# Patient Record
Sex: Female | Born: 1980
Health system: Southern US, Community
[De-identification: ages and names within clinical notes are randomized; demographics above are authoritative.]

---

## 2001-06-10 ENCOUNTER — Other Ambulatory Visit: Admission: RE | Admit: 2001-06-10 | Discharge: 2001-06-10 | Payer: Self-pay | Admitting: Obstetrics and Gynecology

## 2001-09-23 ENCOUNTER — Inpatient Hospital Stay (HOSPITAL_COMMUNITY): Admission: AD | Admit: 2001-09-23 | Discharge: 2001-09-23 | Payer: Self-pay | Admitting: Obstetrics and Gynecology

## 2001-11-13 ENCOUNTER — Ambulatory Visit (HOSPITAL_COMMUNITY): Admission: RE | Admit: 2001-11-13 | Discharge: 2001-11-13 | Payer: Self-pay | Admitting: Obstetrics and Gynecology

## 2001-11-13 ENCOUNTER — Inpatient Hospital Stay (HOSPITAL_COMMUNITY): Admission: AD | Admit: 2001-11-13 | Discharge: 2001-11-16 | Payer: Self-pay | Admitting: Obstetrics and Gynecology

## 2002-06-23 ENCOUNTER — Other Ambulatory Visit: Admission: RE | Admit: 2002-06-23 | Discharge: 2002-06-23 | Payer: Self-pay | Admitting: Obstetrics and Gynecology

## 2004-06-28 ENCOUNTER — Other Ambulatory Visit: Admission: RE | Admit: 2004-06-28 | Discharge: 2004-06-28 | Payer: Self-pay | Admitting: Obstetrics and Gynecology

## 2004-08-17 ENCOUNTER — Inpatient Hospital Stay (HOSPITAL_COMMUNITY): Admission: AD | Admit: 2004-08-17 | Discharge: 2004-08-17 | Payer: Self-pay | Admitting: Obstetrics and Gynecology

## 2004-10-20 ENCOUNTER — Ambulatory Visit (HOSPITAL_COMMUNITY): Admission: RE | Admit: 2004-10-20 | Discharge: 2004-10-20 | Payer: Self-pay | Admitting: Obstetrics and Gynecology

## 2004-12-12 ENCOUNTER — Other Ambulatory Visit: Admission: RE | Admit: 2004-12-12 | Discharge: 2004-12-12 | Payer: Self-pay | Admitting: Obstetrics and Gynecology

## 2005-01-24 ENCOUNTER — Inpatient Hospital Stay (HOSPITAL_COMMUNITY): Admission: AD | Admit: 2005-01-24 | Discharge: 2005-01-24 | Payer: Self-pay | Admitting: Obstetrics and Gynecology

## 2005-01-25 ENCOUNTER — Inpatient Hospital Stay (HOSPITAL_COMMUNITY): Admission: AD | Admit: 2005-01-25 | Discharge: 2005-01-25 | Payer: Self-pay | Admitting: Obstetrics and Gynecology

## 2005-03-28 ENCOUNTER — Inpatient Hospital Stay (HOSPITAL_COMMUNITY): Admission: AD | Admit: 2005-03-28 | Discharge: 2005-03-28 | Payer: Self-pay | Admitting: Obstetrics and Gynecology

## 2005-04-01 ENCOUNTER — Inpatient Hospital Stay (HOSPITAL_COMMUNITY): Admission: AD | Admit: 2005-04-01 | Discharge: 2005-04-04 | Payer: Self-pay | Admitting: Obstetrics and Gynecology

## 2007-05-20 ENCOUNTER — Inpatient Hospital Stay (HOSPITAL_COMMUNITY): Admission: AD | Admit: 2007-05-20 | Discharge: 2007-05-20 | Payer: Self-pay | Admitting: Obstetrics and Gynecology

## 2007-07-24 ENCOUNTER — Inpatient Hospital Stay (HOSPITAL_COMMUNITY): Admission: AD | Admit: 2007-07-24 | Discharge: 2007-07-26 | Payer: Self-pay | Admitting: Obstetrics and Gynecology

## 2007-07-25 ENCOUNTER — Encounter (INDEPENDENT_AMBULATORY_CARE_PROVIDER_SITE_OTHER): Payer: Self-pay | Admitting: Obstetrics and Gynecology

## 2010-10-18 NOTE — Op Note (Signed)
NAMEANIDA, [name]                ACCOUNT NO.:  [id number]   MEDICAL RECORD NO.:  [id number]          PATIENT TYPE:  INP   LOCATION:  9128                          FACILITY:  [facility]   PHYSICIAN:  Hal Morales, M.D.DATE OF BIRTH:  Jun 24, 1980   DATE OF PROCEDURE:  07/25/2007  DATE OF DISCHARGE:  07/26/2007                               OPERATIVE REPORT   PREOPERATIVE DIAGNOSIS:  Desire for surgical sterilization and  epigastric hernia.   POSTOPERATIVE DIAGNOSES:  Desire for surgical sterilization and  epigastric hernia.   OPERATION:  Epigastric hernia repair, bilateral tubal sterilization.   SURGEON:  Dr. Leonie Man for the epigastric hernia repair.  Dr. Dierdre Forth for the bilateral tubal sterilization.   ANESTHESIA:  General orotracheal.   ESTIMATED BLOOD LOSS:  Less than 25 mL.   COMPLICATIONS:  None.   FINDINGS:  The tubes were normal for the postpartum state.   PROCEDURE IN DETAIL:  The patient was in the process of undergoing an  epigastric hernia repair.  Once the hernia sac had been excised and,  therefore, left the peritoneum open, the left fallopian tube was  identified, followed to its fimbriated end, then grasped at the isthmic  portion and elevated.  A suture of 2-0 chromic was placed through the  mesosalpinx and tied fore and aft on the knuckle of tube.  A second  ligature was placed proximal to that and the intervening knuckle of tube  excised.  The cut ends were cauterized.  A similar procedure was carried  out on the opposite side and hemostasis noted to be adequate.  At that  time, Dr. Lurene Shadow completed the hernia repair which will be dictated  under a separate operative report.      Hal Morales, M.D.  Electronically Signed     VPH/MEDQ  D:  07/25/2007  T:  07/26/2007  Job:  [id number]

## 2010-10-18 NOTE — Discharge Summary (Signed)
NAMEJESSIKA, [name]                ACCOUNT NO.:  [id number]   MEDICAL RECORD NO.:  [id number]          PATIENT TYPE:  INP   LOCATION:  9128                          [facility]   PHYSICIAN:  Janine Limbo, M.D.DATE OF BIRTH:  1981-03-08   DATE OF ADMISSION:  07/24/2007  DATE OF DISCHARGE:  07/26/2007                               DISCHARGE SUMMARY   ADMITTING DIAGNOSES:  1. Intrauterine pregnancy at 40 and 5/7 weeks.  2. Active labor.  3. Group beta strep negative.  4. Desires bilateral tubal ligation.  5. Supraumbilical hernia.   DISCHARGE DIAGNOSES:  1. Intrauterine pregnancy at term.  2. Status post spontaneous vaginal delivery of a viable female on      February 18 weighing 7 pounds 10 ounces with Apgars 8 and 8.  3. Status post bilateral tubal ligation.  4. Breastfeeding.  5. Status post epigastric hernia repair.   PROCEDURE:  1. General anesthesia.  2. Epigastric hernia repair by Dr. Lurene Shadow.  3. Bilateral tubal ligation by Dr. Pennie Rushing.   HOSPITAL COURSE:  [name] is a 30 year old married black female gravida  3, para 2-0-0-2 who presented on day of admission at 38 and 5/7 weeks in  active labor.  She had been followed by MD service at [facility] and was  desiring postpartum bilateral tubal ligation.  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  On admission the patient's cervix was 4-5 cm.  She was  admitted to labor and delivery where she received a dose of Stadol.  Fetal heart tracing was reassuring and reactive.  She was contracting  every 2-7 minutes on the monitor.  Just before or just at 7:00 Dr.  Estanislado Pandy performed artificial rupture of membranes, clear fluid.  The  patient's cervix was 5, 80, -2 and the patient was comfortable with her  Stadol.  The patient progressed there soon after to a spontaneous  vaginal delivery at 2049 hours on  February 18 of a viable female by the  name of Melinda [name].  Apgars were 8 and 8.  Weight was 7 pounds 10 ounces.  She had an intact perineum.  She had a less than 1 cm vulvar tear that  was repaired with a 4-0 Monocryl and the patient was doing well.  She  continued to desire postpartum bilateral tubal ligation.  She was taken  to mother/baby after her recovery in L and D.  By postpartum day #1 she  was n.p.o. and awaiting her bilateral tubal ligation that was scheduled  for 3:30 in the afternoon.  She said she mainly had pain from abdominal  fundal cramping which was well controlled with Motrin.  She had normal  lochia.  She was voiding without difficulty with slight stinging.  She  had had some difficulty arousing newborn female for feeds.  She was breast  feeding and newborn female did receive circumcision on postpartum day #1.  Her vital signs were stable.  Her hemoglobin was 12.7 and  white count  was 13.6.  Platelets were stable at 189.  Her physical examination was  within normal limits.  She did have 1+ edema in her bilateral lower  extremities but negative Homan.  Fundus was firm, U minus 1.   She was taken to the OR and procedures were complete by 5 p.m.  Dr.  Lurene Shadow first performed her epigastric hernia repair and then Dr. Pennie Rushing  came in for bilateral tubal ligation.  She was under general anesthesia.  There were no complications.  Minimal blood loss and the patient was  taken to the PACU in good condition.  By postpartum day #2 the patient  was up at her bedside.  She was doing well.  She reports lochia was  decreased like a period.  Her abdominal pain was only with position  changes and with movement.  She reported that breast feeding was  improved and newborn female was doing much better with that.  She denied  nausea, vomiting, diarrhea, PIH or UTI signs or symptoms.  No bowel  movement yet but positive flatus.  She was voiding without difficulty  and tolerating a regular diet, was  up ad lib and was ready to go home.  Her vital signs remained stable.  She did have a slightly elevated heart  rate status post her bilateral tubal ligation and hernia repair.  Heart  rate was running 99-113 whereas prior to her surgery she was 84-101.  Her physical examination was within normal limits.  Lungs were clear.  Breasts were soft, nontender and intact.  Her abdomen was soft.  It was  appropriately tender.  Her postop dressing was clean, dry and intact.  It was midline and vertical.  Bowel sounds were present x4 quadrants.  Her fundus was firm and below her umbilicus.  Her extremities, she  continued to have some generalized edema but no Homan.  The patient was  deemed to have received full benefit of her hospital stay and was  discharged home.   DISCHARGE INSTRUCTIONS:  Were per [facility] pamphlet.  Warning signs and  symptoms to report were reviewed.   DISCHARGE MEDICATIONS:  1. Motrin 600 mg one tab p.o. q.6 h. p.r.n. pain.  2. Vicodin one to two tabs p.o. q.4-6 h. p.r.n. moderate to severe      pain.   The patient was discharged to home.  Condition was stable.  Followup to  occur in 6 weeks or p.r.n.      Candice Denny Levy, [place]      Janine Limbo, M.D.  Electronically Signed    CHS/MEDQ  D:  07/26/2007  T:  07/26/2007  Job:  [id number]

## 2010-10-18 NOTE — H&P (Signed)
Melinda [name], Melinda [name]                ACCOUNT NO.:  [id number]   MEDICAL RECORD NO.:  [id number]          PATIENT TYPE:  INP   LOCATION:  9173                          [facility]  [facility]   PHYSICIAN:  Dois Davenport A. Rivard, M.D. DATE OF BIRTH:  10-15-1980   DATE OF ADMISSION:  07/24/2007  DATE OF DISCHARGE:                              HISTORY & PHYSICAL   PRIORITY ADMISSION HISTORY AND PHYSICAL   Patient is a 30 year old, married, black female, gravida 3, para 2-0-0-  2, 40 and 5/7th weeks, who was sent from the office for a probable  labor.  Patient reports contractions beginning this morning and went  into the office for monitoring, was having regular contractions.  RN  reported some mild variables with her contractions.  She was breathing  with her contractions at the office.  They did not check her cervix  there.  She reports 2 cm earlier in the office this week.  Denies  leakage of fluid, vaginal bleeding, abnormal discharge, prodromal  herpetic symptoms, UTI or PIH signs or symptoms, nausea, vomiting,  diarrhea, shortness of breath, cough, or fever.  Reports positive fetal  movement, states they are expecting a boy.  She is breathing with her  contractions.  She reports increased vaginal pressure with her  contractions.  Followed by MD Service at [facility].  She desires a postpartum  BTL.   PAST MEDICAL HISTORY:  1. A supraumbilical hernia, which she states that the doctors were      going to try to repair when she had her postpartum BTL.  2. History of HSV-2.  She is on Valtrex prophylaxis.  3. History of PIH.  4. History of abnormal Pap.  5. GBS negative.   PRENATAL LABORATORY DATA:  Patient is A-positive, Rh antibody screen  negative, sickle cell negative, RPR nonreactive, Rubella immune,  hepatitis surface antigen negative, HIV nonreactive, cystic fibrosis  negative.  Her hemoglobin at her new OB visit was 12.5, and her  platelets were 310.  Her hemoglobin on November 17th was 12.1.   She had  her Glucola that day as well, which was within normal limits.  Group  Beta Strep negative.  Patient declined gonorrhea and Chlamydia of third  trimester.   PAST SURGICAL HISTORY:  1. Gravida 1:  The patient had a spontaneous vaginal delivery in June      of 2003, a female infant weighing 7 pounds 5 ounces, at just over 39      weeks.  She was induced for [facility] and was delivered by a nurse      midwife.  2. Gravida 2:  Spontaneous vaginal delivery in October of 2006, a female      infant weighing 6 pounds 11 ounces, at 41 and 2/7th weeks.  She was      induced for post dates.  3. Gravida 3:  Current pregnancy.   PAST GYNECOLOGIC HISTORY:  Patient reports menarche at 30 years of age,  a 28-day cycle.  She had PIH with her first pregnancy.  She also had  Group Beta Strep with her first pregnancy, history  of HSV-2 diagnosed in  2002 with no recent outbreaks, reports oral contraceptive pills for  contraceptive use in the past, reports occasional yeast infection,  abnormal Pap in 2006 with ASCUS, positive high-risk HPV.  She reports  normal childhood illnesses and has only been hospitalized in the past  for childbirth x2.   GENETIC HISTORY:  Unremarkable.   FAMILY HISTORY:  Maternal great-grandmother with an MI, her mother and  maternal grandmother hypertension, mother with varicosities, maternal  grandmother diabetic, mom with thyroid disease, and mom on dialysis.   SOCIAL HISTORY:  Married, black female.  Reports Pentecostal faith.  Husband's name is Aurther Loft.  The patient reports 15 years of education,  works at [employer] full time.  Father of the baby 14 years of education and  is a full time Paediatric nurse.  She denied any alcohol, tobacco, or illicit drug  use.   HISTORY OF PRESENT PREGNANCY:  The patient was seen on July 22nd for a  new OB, was approximately 10 and 4/7th weeks, at that time was planning  CNM care.  She had an ultrasound that day secondary to inability to hear  fetal heart  tones showing SIUP at 10 and 1/7th weeks, EDC was kept at  February 13.  Pap and cultures were done.  She declined first trimester  screen at that time.  Her Pap was within normal limits in March of 2008.  She had a visit at 45 and 3/7th weeks where she had anatomy ultrasound.  A hernia was diagnosed by Dr. Pennie Rushing.  She noticed a 5-cm bulge to  right of midline with Valsalva, which was easily reduced and minimal  tenderness.  She did have a Development worker, international aid consult, her ultrasound with  SIUP, size equal to dates, normal fluid, suggest of female, all anatomy  was seen, and normal growth and development noted.  The patient seen  then at 23 and 2/7th weeks.  She had had a consult with Dr. Lurene Shadow, had  noted may try to repair after her delivery with BTL.  The patient was  seen for a work-in at 27 weeks for some spotting.  No blood was noted in  the vault at the time of her exam.  The cervix was fingertip and long.  She was given bleeding precautions.  The patient returned at 59 and  4/7th weeks, had a one-hour GTT, had not experienced any other spotting  at that time.  She was given a Valtrex prescription for refills.  The  patient's pregnancy continued to progress without notable complications.  She complained of a lump on her left labia and probable HSV outbreak at  16 and 5/7th weeks, but on exam per Dr. Normand Sloop was consistent with a  sebaceous cyst.  Did note an HSV outbreak on January 3rd and Valtrex 500  mg at that time was started x3 days and then to continue until delivery.  The patient's cervix was closed at 35 and 4/7th weeks.  A GBS was done,  which was negative.  She declined gonorrhea and Chlamydia cultures.  She  was given Vicodin at approximately 37 weeks secondary to cramping and  back pain.  Her cervix was fingertip.  Internal os was closed.   PHYSICAL EXAMINATION:  VITAL SIGNS ON ADMISSION:  130/85 blood pressure,  98.1 temperature, respirations 20, heart rate 98, EFM 130  baseline,  reactive, moderate variability and early decels.  Toco:  Uterine  contractions every 2 to 7 minutes, moderate on palpation.  GENERAL:  Alert and oriented x3.  Labored breathing with her  contractions.  HEENT:  Within normal limits.  LUNGS:  Clear to auscultation bilaterally.  CARDIOVASCULAR:  Regular rate and rhythm without murmur.  ABDOMEN:  Soft and nontender, gravid, estimated fetal weight 7-1/2 to 8  pounds.  PELVIC EXAM:  The cervix was 4+, 70%, minus 2 to minus 3, vertex,  bulging bag of water to patient's left, a stretchy cervix, no external  or internal abnormalities.  EXTREMITIES:  DTRs 1+, no clonus, 1+ edema.   IMPRESSION:  1. Intrauterine pregnancy at 40 and 5.  2. Active labor.  3. Group Beta Strep negative.  4. Desires bilateral tubal ligation.  5. Supraumbilical hernia.  6. Desires Stadol for labor pain.   PLAN:  1. Admit to [facility] with Dr. Estanislado Pandy as attending physician.  2. Routine L&D orders.  3. Support p.r.n.  4. Anticipate SVD.  5. Dr. Estanislado Pandy plans AROM if labor augmentation necessary.      Candice Garrison, CNM      Dois Davenport A. Rivard, M.D.  Electronically Signed    CHS/MEDQ  D:  07/24/2007  T:  07/24/2007  Job:  [id number]

## 2010-10-18 NOTE — Op Note (Signed)
Melinda [name], Melinda [name]                ACCOUNT NO.:  [id number]   MEDICAL RECORD NO.:  [id number]          PATIENT TYPE:  INP   LOCATION:  9128                          [facility]   PHYSICIAN:  Leonie Man, M.D.   DATE OF BIRTH:  07-Aug-1980   DATE OF PROCEDURE:  07/24/2007  DATE OF DISCHARGE:  07/26/2007                               OPERATIVE REPORT   PREOPERATIVE DIAGNOSIS:  Ventral epigastric hernia.   POSTOPERATIVE DIAGNOSIS:  Ventral epigastric hernia.   PROCEDURE:  Repair of ventral hernia with mesh.   SURGEON:  Leonie Man, M.D.   ASSISTANT:  Hal Morales, M.D.   NOTE:  The patient is a 30 year old female who is now recently  postpartum, noted to have a fairly large ventral epigastric hernia which  became somewhat more symptomatic during pregnancy.  She delivered her  baby spontaneously approximately 24 hours ago and is coming to the  operating room now under the care of Dr. Pennie Rushing for a postpartum tubal  ligation.  She wishes at this point to have her ventral hernia repaired.  She understands the risks and potential benefits of surgery and gives  her consent to same.   PROCEDURE:  The patient is positioned supinely and following the  induction of satisfactory general anesthesia, the abdomen is prepped and  draped to be included in the sterile operative field.  The patient time-  out identifying the patient as Melinda [name] and the operation to be done  as ventral hernia and postpartum tubal ligation was made.  I made a  midline incision just above the umbilicus, deepening this through the  skin and subcutaneous tissues down to the region of the epigastric  hernia, which had incarcerated round ligament within it.  This was  dissected free on all sides with the dissection carried down to the  fascia.  The fascia was somewhat attenuated.  The hernia sac was opened  and the adhesions to the sac were taken down.  I then dissected away the  round ligament and  transected it.  Dr. Pennie Rushing then at that point when  ahead and did a postpartum tubal ligation, which will be dictated in a  separate note.  At the end of the tubal ligation I placed a 8.4-cm of  Bard Ventralex mesh within the defect and suturing this to the sides of  the fascia.  This was done with 0 Prolene sutures.  I then closed the  defect over the mesh with a running 0 Prolene.  Sponge and instrument  counts were doubly verified.  The incision was then closed with  interrupted 2-0  Vicryl sutures, followed by running 4-0 Monocryl sutures reinforced with  Steri-Strips.  Sterile dressings were applied, the anesthetic reversed  and the patient removed from the operating room to the recovery room in  stable condition.  She tolerated the procedure well.      Leonie Man, M.D.  Electronically Signed     PB/MEDQ  D:  07/25/2007  T:  07/26/2007  Job:  [id number]   cc:   Hal Morales, M.D.  Fax:  [phone] 

## 2010-10-21 NOTE — H&P (Signed)
[facility]  Patient:    Melinda [name], Melinda [name] Visit Number: [id number] MRN: [id number]          Service Type: OBS Location: 910B 9162 01 Attending Physician:  Esmeralda Arthur Dictated by:   Nigel Bridgeman, C.N.M. Admit Date:  11/13/2001                           History and Physical  HISTORY OF PRESENT ILLNESS:   The patient is a 30 year old gravida 1, para 0, at 39-3/7th weeks who presents on November 13, 2001 for induction secondary to mild pregnancy-induced hypertension, and in the third trimester.  Pregnancy has been remarkable for:  1. Late care with patient beginning care at approximately 17 weeks. 2. Positive group B strep. 3. History of HSV with no current lesions or recent outbreak. 4. Mild PIH in the third trimester.  PRENATAL LABORATORY DATA:     Blood type is A-positive.  Rh antibody negative. VDRL nonreactive.  Rubella titer positive.  Hepatitis B surface antigen negative.  Sickle cell test negative.  Pap was normal.  GC and Chlamydia cultures were negative.  Glucose challenge was normal.  AFP was normal.  EDC of November 16, 2001 was established by ultrasound at approximately 17 weeks. Hemoglobin upon entry into practice was 13.  It was 12 at 27 weeks.  Group B strep culture was positive at 36 weeks.  HISTORY OF PRESENT PREGNANCY:                    The patient entered care at approximately 17 weeks.  She had an ultrasound at that time which established dating.  She had some occasional hypoglycemic episodes that resolved with food intake.  She did have upper respiratory symptoms for nine weeks or so.  She had a positive beta strep noted at 33 weeks.  She began to have some mild elevations of her blood pressure at 37 weeks, 110/80 was the value at that point.  Her prevalues were 110/70.  She had had approximately eight pound weight gain in almost ten days.  She had 1-2+ edema in the lower extremities.  She was placed on bed rest at that  time.  Her cervix was a fingertip and 60%.  She had no protein in her urine.  Over the next three to four visits, her pressures were 126/100, although it resolved to 120/86 with left side lying position, 120/80 on November 07, 2001, and on November 11, 2001, it was 140/100 with a recheck of 100/86.  On that examination, her cervix was 1-2, 70% vertex, and -2.  There was 1-2+ edema in the lower extremities.  She had no headache, visual symptoms, or epigastric pain.  A consultation was held with Dr. Normand Sloop regarding this patient appropriateness for induction.  She did agree that this was a reasonable plan. A 24 hour urine was begun on November 12, 2001, to be completed on November 13, 2001. This will determine if magnesium sulfate therapy is needed for labor.  OBSTETRICAL HISTORY:          The patient is a primigravida.  PAST MEDICAL HISTORY:         She had never had a gynecological exam prior to pregnancy.  She was diagnosed with HSV in 2002.  She had an outbreak at the very first of her pregnancy, but none since then.  She has occasional yeast infections.  She reports usual childhood illnesses.  ALLERGIES:                    No known drug allergies.  FAMILY HISTORY:               Maternal great-grandmother had a heart attack. Her mother and maternal grandmother have hypertension.  Her mother has varicose veins.  Maternal grandmother has some type of diabetes.  Maternal grandmother had a questionable thyroid problem.  Maternal grandmother was also on dialysis.  Her mother had migraines when she was younger.  There is a history of some alcohol use on the patients fathers side.  GENETIC HISTORY:              Unremarkable.  SOCIAL HISTORY:               The patient is engaged to the father of the baby.  He is involved and supportive.  His name is Junie Avilla.  The patient is [place] of the Pentecostal faith.  She has three years of college. She is continuing college and is also employed at a  [employer].  Her husband has two years of college.  He is employed as a Paediatric nurse.  She has been followed by the certified nurse midwife service at [facility].  She denies any alcohol, drug, or tobacco use during this pregnancy.  PHYSICAL EXAMINATION:  VITAL SIGNS:                  Blood pressure on last evaluation was 140/100 with repeat of 100/86.  Other vital signs are stable.  HEENT:                        Within normal limits.  LUNGS:                        Bilateral breath sounds are clear.  HEART:                        Regular rate and rhythm without murmur.  BREASTS:                      Soft and nontender.  ABDOMEN:                      Fundal height approximately 39 cm.  Estimated fetal weight of 7 pounds to 7-1/2 pounds.  There are occasional uterine contractions noted.  Fetal heart rate is 150 by Dopplers and this was November 11, 2001.  EXTREMITIES:                  Deep tendon reflexes are 2+ without clonus. There is 1-2+ edema noted in the lower extremities.  PELVIC EXAMINATION:           On November 11, 2001, was 1-2 cm, 70% vertex, at a -2 station.  LABORATORY DATA:              Urine on November 11, 2001, was trace for protein on dipstick.  IMPRESSION:                   1. Intrauterine pregnancy at 39-3/7th weeks.                               2. Pregnancy-induced hypertension.  3. Positive group B streptococcus.                               4. History of herpes simplex virus with no                                  current lesions.  PLAN:                         1. Admit to birthing suite per consult with                                  Dr. Dois Davenport Rivard as current on call                                  physician and Dr. Estanislado Pandy is on coming on November 13, 2001.                               2. Routine CNM orders.                               3. A 24-hour urine will have been completed                                   earlier in the day on November 13, 2001, with                                  CBC, hold to clot, RPR, and PIH labs done at                                   that time.  These values will be evaluated to                                  determine whether the patient requires                                  magnesium sulfate for labor.                               4. Plan Cytotec placement on the evening of November 13, 2001, with initiation of Pitocin  induction on the morning of November 14, 2001.                               5. Plan group B strep prophylaxis with                                  penicillin G per standard dosing once labor                                  ensues.                               6. Close observation of maternal fetal status.                               7. Pain medication per patient request. Dictated by:   Nigel Bridgeman, C.N.M. Attending Physician:  Esmeralda Arthur DD:  11/13/01 TD:  11/13/01 Job: [id number]

## 2010-10-21 NOTE — H&P (Signed)
NAMEBLANCH, STANG                ACCOUNT NO.:  [id number]   MEDICAL RECORD NO.:  [id number]          PATIENT TYPE:  INP   LOCATION:  9167                          FACILITY:  [facility]   PHYSICIAN:  Janine Limbo, M.D.DATE OF BIRTH:  Sep 01, 1980   DATE OF ADMISSION:  04/01/2005  DATE OF DISCHARGE:                                HISTORY & PHYSICAL   Ms. Nodal is a 30 year old married black female, gravida 2 para 1-0-0-1 at 67  and 2/7 weeks, who presents for induction of labor secondary to post dates.  She denies bleeding, leaking or signs and symptoms of PIH.  She is having  occasional mild contractions.  Her pregnancy has been followed by the  [facility] GYN certified nurse midwife service and is remarkable for:  1. History of PIH.  2. History of abnormal Pap with high risk HPV.  3.  History of HSV.  4. Group-B strep negative.  She denies signs and symptoms  of  HPV since March 17, 2005 and she has been taking Valtrex during the  end of her third trimester.  Her prenatal labs were collected on August 30, 2004.  Hemoglobin 13.6, hematocrit 39.7, platelets 314,000.  Blood type A  positive.  Antibody negative.  Sickle cell trait negative.  RPR nonreactive.  Rubella immune.  Hepatitis C surface antigen negative.  Gonorrhea negative  Chlamydia negative.  Her one hour Glucola was collected on January 05, 2005  and was 119.  Hemoglobin at that time was 12.8.  Culture of the vaginal  tract for Group-B strep on February 24, 2005 was negative.   HISTORY OF PRESENT PREGNANCY:  The patient presented for care at [facility] on August 30, 2004 at 10 and 5/[redacted] weeks gestation.  Pregnancy  ultrasonography was obtained at that visit due to no audible fetal heart  tones.  Ultrasonography at [redacted] weeks gestation showed growth consistent with  previous dating.  Anatomy scan was complete at 21 and 4/[redacted] weeks gestation  with estimated fetal weight in the 25th-50th percentile at that time with  normal  fluid.  The patient was to have a repeat Pap smear in July 2006 due  to an abnormal Pap smear in January 2006 and a colposcopy in March 2006.  Previous Pap result in January showed ascus with high risk HPV present.  Pap  smear in July 2006 was negative.  The patient was seen at [redacted] weeks gestation  for lower back pain.  The patient was seen at [redacted] weeks gestation for  spotting during wiping, irregular contractions as well as frequent  urination.  Urine culture was negative at that time.  The rest of her  prenatal care was unremarkable.   OB HISTORY:  She is a gravida 2 para 1-0-0-1 in June 2003.  She had a  vaginal delivery of a female infant weighing 7 pounds and 5 ounces at 39 and  4/[redacted] weeks gestation after seven hours of labor.  She was induced due to  elevated blood pressure.  She did not have preeclampsia with that pregnancy.  She had Group-B  strep with her previous pregnancy.   MEDICAL HISTORY:  She has no medication allergies.  She experienced menarche  at the age of 50 with 28 day cycles lasting five days.  She was diagnosed in  2002 with HSV.  She has had a yeast infection in the past. She has had an  abnormal Pap smear of ascus with high risk HPV in January 2006 followed by  colposcopy in March 2006.   FAMILY MEDICAL HISTORY:  Remarkable for mother and maternal grandmother with  hypertension.  Maternal grandmother with unknown type of diabetes.  Maternal  grandmother with unknown disease.  Maternal grandmother with dialysis.  Genetic history is negative.   SOCIAL HISTORY:  The patient is married to the father of the baby.  His name  is Aurther Loft.  He is involved and supportive.  They are of the [employer]  faith.  The patient has had 16 years of education and is employed full time  with [employer].  Father of the baby has had 14 years of education and is  employed full time as a Paediatric nurse.  They deny any alcohol, tobacco or illicit  drug use with the pregnancy.   OBJECTIVE DATA:  Vital  signs are stable.  She is afebrile.  HEENT:  Grossly within normal limits.  CHEST:  Clear to auscultation.  HEART:  Respiratory rate.  ABDOMEN: Gravid and contoured.  Fundal height extending approximately 40 cm  above pubic symphysis.  Fetal heart rate is reactive reassuring with  occasional mild variables.  There are occasional irregular contractions that  are mild.  Sterile speculum exam shows no signs or symptoms of HSV.  Cervix  is posterior, 2 cm, 70% , vertex-2.  EXTREMITIES: Within normal limits.   ASSESSMENT:  1.  Intrauterine pregnancy at term.  2. Unfavorable cervix. 3. Group-B strep      negative.   PLAN:  1.  Admit to birthing suite.  Dr. Stefano Gaul has been notified.  2. Routine      CNM orders.  3. Reviewed induction of labor and plan for Cytotec.  We      will place that tonight and repeat in 4 hours.  Certified nurse midwife      to recheck cervix in the morning to determine artificial rupture of      membranes versus pitocin for the induction process.      Cam Hai, C.N.M.      Janine Limbo, M.D.  Electronically Signed    KS/MEDQ  D:  04/01/2005  T:  04/02/2005  Job:  [id number]

## 2011-02-24 LAB — CBC
HCT: 35.5 — ABNORMAL LOW
Hemoglobin: 12.7
Hemoglobin: 15.3 — ABNORMAL HIGH
MCHC: 35.8
MCV: 92.6
Platelets: 208
RBC: 3.84 — ABNORMAL LOW
WBC: 13.6 — ABNORMAL HIGH
WBC: 8.6

## 2011-02-24 LAB — RPR: RPR Ser Ql: NONREACTIVE

## 2011-03-10 LAB — WET PREP, GENITAL
Trich, Wet Prep: NONE SEEN
Yeast Wet Prep HPF POC: NONE SEEN

## 2011-03-10 LAB — GC/CHLAMYDIA PROBE AMP, GENITAL
Chlamydia, DNA Probe: NEGATIVE
GC Probe Amp, Genital: NEGATIVE

## 2011-03-10 LAB — URINALYSIS, ROUTINE W REFLEX MICROSCOPIC
Glucose, UA: NEGATIVE
Hgb urine dipstick: NEGATIVE
Protein, ur: 30 — AB
Urobilinogen, UA: 1
pH: 7

## 2011-03-10 LAB — URINE CULTURE: Colony Count: 10000

## 2011-03-10 LAB — URINE MICROSCOPIC-ADD ON

## 2013-12-17 ENCOUNTER — Other Ambulatory Visit (HOSPITAL_COMMUNITY)
Admission: RE | Admit: 2013-12-17 | Discharge: 2013-12-17 | Disposition: A | Discharge status: Home / Self Care | Payer: Self-pay | Source: Ambulatory Visit | Attending: Family Medicine | Admitting: Family Medicine

## 2013-12-17 ENCOUNTER — Other Ambulatory Visit: Payer: Self-pay | Admitting: Family Medicine

## 2013-12-17 DIAGNOSIS — Z124 Encounter for screening for malignant neoplasm of cervix: Secondary | ICD-10-CM | POA: Insufficient documentation

## 2013-12-17 DIAGNOSIS — Z1151 Encounter for screening for human papillomavirus (HPV): Secondary | ICD-10-CM | POA: Insufficient documentation

## 2013-12-18 LAB — CYTOLOGY - PAP

## 2016-04-23 ENCOUNTER — Emergency Department (HOSPITAL_COMMUNITY): Payer: 59

## 2016-04-23 ENCOUNTER — Emergency Department (HOSPITAL_COMMUNITY)
Admission: EM | Admit: 2016-04-23 | Discharge: 2016-04-23 | Disposition: A | Discharge status: Home / Self Care | Payer: 59 | Attending: Emergency Medicine | Admitting: Emergency Medicine

## 2016-04-23 ENCOUNTER — Encounter (HOSPITAL_COMMUNITY): Payer: Self-pay

## 2016-04-23 DIAGNOSIS — M542 Cervicalgia: Secondary | ICD-10-CM | POA: Insufficient documentation

## 2016-04-23 DIAGNOSIS — F0781 Postconcussional syndrome: Secondary | ICD-10-CM | POA: Diagnosis not present

## 2016-04-23 DIAGNOSIS — H539 Unspecified visual disturbance: Secondary | ICD-10-CM | POA: Insufficient documentation

## 2016-04-23 DIAGNOSIS — R51 Headache: Secondary | ICD-10-CM | POA: Diagnosis present

## 2016-04-23 LAB — PREGNANCY, URINE: PREG TEST UR: NEGATIVE

## 2016-04-23 MED ORDER — CYCLOPENTOLATE HCL 1 % OP SOLN
1.0000 [drp] | Freq: Once | OPHTHALMIC | Status: AC
  Administered 2016-04-23: 1 [drp] via OPHTHALMIC
  Filled 2016-04-23: qty 2

## 2016-04-23 NOTE — ED Notes (Signed)
Pt. Returned from the CT scan and is now ambulating with Lanora ManisElizabeth, EMT to the bathroom.  Gait steady

## 2016-04-23 NOTE — Discharge Instructions (Signed)
Please go to Dr. Eliane DecreePatel's office tomorrow at 8:30 AM for reevaluation of vision changes. Your head and neck CT scans were both negative and you likely are having mild post-concussive symptoms. Please take ibuprofen as needed for your headache and neck pain, and follow up with your PCP as needed or come back to the ED for any new or worsening symptoms.

## 2016-04-23 NOTE — ED Provider Notes (Signed)
[facility]-EMERGENCY DEPT Provider Note   CSN: [id number] Arrival date & time: 04/23/16  0908     History   Chief Complaint No chief complaint on file.   HPI Melinda [name] is a 35 y.o. female.  Patient is 35 yo F with no significant PMH, presenting to ED this morning with mild headache, blurred vision, and photphobia after running into doorframe and getting knocked unconscious yesterday around 6:30 PM. Patient states she tripped and fell forward while chasing her kids, hitting her head and scraping her left knee. She denies any numbness, weakness, dizziness, change in speech, difficulty ambulating, nausea, or vomiting. She went to bed with a mild headache but no other complaints, and wanted to get evaluated this morning due to slightly blurred vision and photophobia. She did not take anything for pain. No history of prior head trauma or vision problems.      History reviewed. No pertinent past medical history.  There are no active problems to display for this patient.   History reviewed. No pertinent surgical history.  OB History    No data available       Home Medications    Prior to Admission medications   Not on File    Family History No family history on file.  Social History Social History  Substance Use Topics  . Smoking status: Never Smoker  . Smokeless tobacco: Never Used  . Alcohol use Not on file     Allergies   Patient has no known allergies.   Review of Systems Review of Systems  Constitutional: Negative for activity change and appetite change.  HENT: Negative for facial swelling.   Eyes: Positive for photophobia and visual disturbance.  Respiratory: Negative for cough and shortness of breath.   Cardiovascular: Negative for chest pain, palpitations and leg swelling.  Gastrointestinal: Negative for abdominal pain, blood in stool, nausea and vomiting.  Genitourinary: Negative for dysuria, flank pain and hematuria.  Musculoskeletal: Negative for  back pain and neck pain.  Skin: Positive for color change (bruise to left forehead) and wound (superficial abrasion to left knee).  Neurological: Positive for headaches. Negative for dizziness, seizures, syncope, weakness and numbness.     Physical Exam Updated Vital Signs BP (!) 130/111 (BP Location: Left Arm)   Pulse 81   Temp 98.6 F (37 C) (Oral)   Resp 18   LMP 03/23/2016 Comment: tubal ligation  SpO2 99%   Physical Exam  Constitutional: She is oriented to person, place, and time. She appears well-developed and well-nourished. No distress.  HENT:  No Raccoon's eyes, Battle's sign, but ecchymosis with mild contusion noted to left forehead. No hemotympanum, external ears normal bilaterally. No nasal deformity. No frontal or maxillary sinus tenderness. Dentition normal, no malocclusion.  Eyes: Conjunctivae and EOM are normal. Pupils are equal, round, and reactive to light.  Consensual photophobia noted on exam.  Neck: Normal range of motion. Neck supple.  Mild midline cervical tenderness at C4-C5, but no bony crepitus or stepoffs noted. No TTP of paraspinal or trapezius musculature.  Cardiovascular: Normal rate, regular rhythm, normal heart sounds and intact distal pulses.   Pulmonary/Chest: Effort normal and breath sounds normal. No respiratory distress.  Abdominal: Soft. There is no tenderness.  Musculoskeletal: Normal range of motion. She exhibits no edema or tenderness.  Left knee with FROM intact, no joint line or bony TTP. Superficial abrasion noted but no swelling or effusion. No abnormal alignment or patellar mobility. No varus/valgus laxity, neg anterior drawer test, no crepitus.  Strength and sensation grossly intact, distal pulses intact, compartments soft.  Neurological: She is alert and oriented to person, place, and time.  Speech is clear and goal oriented, follows commands. Cranial nerves III - XII without deficit, no facial droop. Normal strength in upper and  lower extremities bilaterally, strong and equal grip strength. Sensation normal to light and sharp touch. Moves extremities without ataxia, coordination intact. Normal finger to nose and rapid alternating movements. Negative Romberg, no pronator drift. Normal gait.  Skin: Skin is warm and dry.  Psychiatric: She has a normal mood and affect.  Nursing note and vitals reviewed.    ED Treatments / Results  Labs (all labs ordered are listed, but only abnormal results are displayed) Labs Reviewed  PREGNANCY, URINE    EKG  EKG Interpretation None       Radiology Ct Head Wo Contrast  Result Date: 04/23/2016 CLINICAL DATA:  Neck pain. EXAM: CT HEAD WITHOUT CONTRAST CT CERVICAL SPINE WITHOUT CONTRAST TECHNIQUE: Multidetector CT imaging of the head and cervical spine was performed following the standard protocol without intravenous contrast. Multiplanar CT image reconstructions of the cervical spine were also generated. COMPARISON:  None. FINDINGS: CT HEAD FINDINGS Brain: No evidence of acute infarction, hemorrhage, hydrocephalus, extra-axial collection or mass lesion/mass effect. Vascular: No hyperdense vessel or unexpected calcification. Skull: Normal. Negative for fracture or focal lesion. Sinuses/Orbits: There is opacification of the right frontal sinus. There is partial opacification of the sphenoid sinuses posteriorly. Paranasal sinuses, mastoid air cells, and middle ears are otherwise normal. Other: Soft tissue swelling is seen over the left forehead. No underlying fracture. Soft tissues including the orbits are otherwise normal. CT CERVICAL SPINE FINDINGS Alignment: Normal. Skull base and vertebrae: No acute fracture. No primary bone lesion or focal pathologic process. Soft tissues and spinal canal: No prevertebral fluid or swelling. No visible canal hematoma. Disc levels:  No other abnormalities. Upper chest: Negative. Other: No other abnormalities. IMPRESSION: 1. No acute intracranial  abnormality. 2. No fracture or traumatic malalignment in the cervical spine. Electronically Signed   By: Gerome Samavid  Williams III M.D   On: 04/23/2016 11:36   Ct Cervical Spine Wo Contrast  Result Date: 04/23/2016 CLINICAL DATA:  Neck pain. EXAM: CT HEAD WITHOUT CONTRAST CT CERVICAL SPINE WITHOUT CONTRAST TECHNIQUE: Multidetector CT imaging of the head and cervical spine was performed following the standard protocol without intravenous contrast. Multiplanar CT image reconstructions of the cervical spine were also generated. COMPARISON:  None. FINDINGS: CT HEAD FINDINGS Brain: No evidence of acute infarction, hemorrhage, hydrocephalus, extra-axial collection or mass lesion/mass effect. Vascular: No hyperdense vessel or unexpected calcification. Skull: Normal. Negative for fracture or focal lesion. Sinuses/Orbits: There is opacification of the right frontal sinus. There is partial opacification of the sphenoid sinuses posteriorly. Paranasal sinuses, mastoid air cells, and middle ears are otherwise normal. Other: Soft tissue swelling is seen over the left forehead. No underlying fracture. Soft tissues including the orbits are otherwise normal. CT CERVICAL SPINE FINDINGS Alignment: Normal. Skull base and vertebrae: No acute fracture. No primary bone lesion or focal pathologic process. Soft tissues and spinal canal: No prevertebral fluid or swelling. No visible canal hematoma. Disc levels:  No other abnormalities. Upper chest: Negative. Other: No other abnormalities. IMPRESSION: 1. No acute intracranial abnormality. 2. No fracture or traumatic malalignment in the cervical spine. Electronically Signed   By: Gerome Samavid  Williams III M.D   On: 04/23/2016 11:36   Dg Knee Complete 4 Views Left  Result Date: 04/23/2016 CLINICAL DATA:  Left knee pain after fall last night. EXAM: LEFT KNEE - COMPLETE 4+ VIEW COMPARISON:  None. FINDINGS: No acute bony abnormality. Specifically, no fracture, subluxation, or dislocation. Soft  tissues are intact. Joint spaces maintained. No joint effusion. IMPRESSION: No acute bony abnormality. Electronically Signed   By: Charlett Nose M.D.   On: 04/23/2016 11:06    Procedures Procedures (including critical care time)  Medications Ordered in ED Medications - No data to display   Initial Impression / Assessment and Plan / ED Course  I have reviewed the triage vital signs and the nursing notes.  Pertinent labs & imaging results that were available during my care of the patient were reviewed by me and considered in my medical decision making (see chart for details).  Clinical Course    Patient is 35 yo F presenting with mild headache, blurred vision, and photophobia after running into doorframe and getting knocked unconscious yesterday around 6:30 PM. Also sustained superficial laceration to left knee. Contusion noted to left forehead but normal neuro exam. Also had mild TTP at C4-C5. CT head, C-spine, and x-ray left knee all negative for acute abnormalities. Patient's EOM intact and PERRL, but consensual photophobia noted on exam and visual acuity 20/50 R, 20/200 L, and 20/40 B. Findings suggestive of possible traumatic iritis, and consult placed to ophthalmology. Dr. Carmela Rima called back and agreed with assessment, and scheduled f/u appointment with patient tomorrow at 8:30 AM in his office. Advised to give patient cyclopentolate to relieve photophobia. Patient agreed to f/u with Dr. Allena Katz and stated her headache subsided. She ambulated with a steady gate and stable for d/c home. Symptoms may also be attributed to post-concussive syndrome, and return precautions discussed for worsening headache, dizziness, vision changes, numbness, weakness, vomiting, or any concerning neurologic symptoms.  Final Clinical Impressions(s) / ED Diagnoses   Final diagnoses:  Neck pain  Post concussion syndrome  Vision changes    New Prescriptions New Prescriptions   No medications on file      Jari Pigg II, [place] 04/23/16 1947    Maia Plan, MD 04/24/16 1327

## 2016-04-23 NOTE — ED Triage Notes (Signed)
Patient complains of being knocked out last night after chasing kids and hitting left forehead on door frame. States that her vision is blurred and slow to answer questions. Alert and oriented, swelling noted to forehead. No nausea

## 2016-10-16 DIAGNOSIS — I4510 Unspecified right bundle-branch block: Secondary | ICD-10-CM | POA: Diagnosis not present

## 2016-10-16 DIAGNOSIS — R072 Precordial pain: Secondary | ICD-10-CM | POA: Diagnosis not present

## 2016-11-14 DIAGNOSIS — H6091 Unspecified otitis externa, right ear: Secondary | ICD-10-CM | POA: Diagnosis not present

## 2017-01-26 ENCOUNTER — Other Ambulatory Visit (HOSPITAL_COMMUNITY)
Admission: RE | Admit: 2017-01-26 | Discharge: 2017-01-26 | Disposition: A | Discharge status: Home / Self Care | Payer: 59 | Source: Ambulatory Visit | Attending: Family Medicine | Admitting: Family Medicine

## 2017-01-26 DIAGNOSIS — Z0000 Encounter for general adult medical examination without abnormal findings: Secondary | ICD-10-CM | POA: Diagnosis not present

## 2017-01-26 DIAGNOSIS — Z01411 Encounter for gynecological examination (general) (routine) with abnormal findings: Secondary | ICD-10-CM | POA: Insufficient documentation

## 2017-01-26 DIAGNOSIS — Z124 Encounter for screening for malignant neoplasm of cervix: Secondary | ICD-10-CM | POA: Diagnosis not present

## 2017-01-26 DIAGNOSIS — Z7689 Persons encountering health services in other specified circumstances: Secondary | ICD-10-CM | POA: Diagnosis not present

## 2017-01-29 ENCOUNTER — Other Ambulatory Visit: Payer: Self-pay | Admitting: Family Medicine

## 2017-02-01 LAB — CYTOLOGY - PAP
Diagnosis: NEGATIVE
HPV (WINDOPATH): NOT DETECTED

## 2017-04-21 DIAGNOSIS — L83 Acanthosis nigricans: Secondary | ICD-10-CM | POA: Diagnosis not present

## 2017-11-29 DIAGNOSIS — G43909 Migraine, unspecified, not intractable, without status migrainosus: Secondary | ICD-10-CM | POA: Diagnosis not present

## 2017-11-29 DIAGNOSIS — R110 Nausea: Secondary | ICD-10-CM | POA: Diagnosis not present

## 2018-02-08 DIAGNOSIS — Z6841 Body Mass Index (BMI) 40.0 and over, adult: Secondary | ICD-10-CM | POA: Diagnosis not present

## 2018-02-08 DIAGNOSIS — L304 Erythema intertrigo: Secondary | ICD-10-CM | POA: Diagnosis not present

## 2018-02-08 DIAGNOSIS — Z7689 Persons encountering health services in other specified circumstances: Secondary | ICD-10-CM | POA: Diagnosis not present

## 2018-02-08 DIAGNOSIS — Z0000 Encounter for general adult medical examination without abnormal findings: Secondary | ICD-10-CM | POA: Diagnosis not present

## 2018-04-22 DIAGNOSIS — H6501 Acute serous otitis media, right ear: Secondary | ICD-10-CM | POA: Diagnosis not present

## 2019-01-30 ENCOUNTER — Other Ambulatory Visit: Payer: Self-pay | Admitting: Podiatry

## 2019-01-30 ENCOUNTER — Ambulatory Visit (INDEPENDENT_AMBULATORY_CARE_PROVIDER_SITE_OTHER): Payer: 59

## 2019-01-30 ENCOUNTER — Ambulatory Visit: Payer: 59 | Admitting: Podiatry

## 2019-01-30 ENCOUNTER — Other Ambulatory Visit: Payer: Self-pay

## 2019-01-30 ENCOUNTER — Encounter: Payer: Self-pay | Admitting: Podiatry

## 2019-01-30 VITALS — BP 103/75 | HR 89 | Resp 16

## 2019-01-30 DIAGNOSIS — M722 Plantar fascial fibromatosis: Secondary | ICD-10-CM

## 2019-01-30 DIAGNOSIS — M79672 Pain in left foot: Secondary | ICD-10-CM

## 2019-01-30 MED ORDER — PREDNISONE 10 MG PO TABS: ORAL_TABLET | ORAL | 0 refills | Status: DC

## 2019-01-30 NOTE — Patient Instructions (Signed)

## 2019-01-30 NOTE — Progress Notes (Signed)
   Subjective:    Patient ID: Melinda [name], female    DOB: 07-Aug-1980, 38 y.o.   MRN: [id number]  HPI    Review of Systems  All other systems reviewed and are negative.      Objective:   Physical Exam        Assessment & Plan:

## 2019-01-31 NOTE — Progress Notes (Signed)
Subjective:   Patient ID: Melinda [name], female   DOB: 38 y.o.   MRN: [id number]   HPI Patient points into the midfoot left stating that she is developed a lot of pain in the arch and she has had problems with the heel and the forefoot and also the outside of the foot.  States this is been going on for an extensive period of time and has worsened recently and patient does not smoke and likes to be active   Review of Systems  All other systems reviewed and are negative.       Objective:  Physical Exam Vitals signs and nursing note reviewed.  Constitutional:      Appearance: She is well-developed.  Pulmonary:     Effort: Pulmonary effort is normal.  Musculoskeletal: Normal range of motion.  Skin:    General: Skin is warm.  Neurological:     Mental Status: She is alert.     Neurovascular status intact muscle strength found to be adequate range of motion within normal limits with patient noted to have exquisite discomfort within the left mid arch area and also mild to moderate discomfort in the heel and into the forefoot.  Patient is found to have good digital perfusion and is well oriented x3     Assessment:  What appears to be mid arch fasciitis with possibility for plantar type fasciitis or inflammatory capsulitis which may be due to change in gait with the mid arch being the most symptomatic currently F2     Plan:  H&P all conditions reviewed and today I did sterile prep and injected the mid arch area left 3 mg Kenalog 5 mg Xylocaine and applied fascial brace with instructions on usage.  Gave instructions for supportive shoe gear and physical therapy and reappoint 2 weeks or earlier if necessary and placed on Deltasone 10 mg prednisone pack to try to reduce the inflammatory complex  X-ray indicated mild depression of the arch but no spurring or other indications of pathology

## 2019-02-03 ENCOUNTER — Telehealth: Payer: Self-pay | Admitting: Podiatry

## 2019-02-03 ENCOUNTER — Encounter: Payer: Self-pay | Admitting: Podiatry

## 2019-02-03 NOTE — Telephone Encounter (Signed)
Pt requested a note for her job where she is having to wear a brace and be out of the type of shoe she should be in. Wants it to go through her next appointment with Dr. Paulla Dolly. Requested the note be e-mailed to her at [email or web address].

## 2019-02-13 ENCOUNTER — Encounter: Payer: Self-pay | Admitting: Podiatry

## 2019-02-13 ENCOUNTER — Other Ambulatory Visit: Payer: Self-pay

## 2019-02-13 ENCOUNTER — Ambulatory Visit (INDEPENDENT_AMBULATORY_CARE_PROVIDER_SITE_OTHER): Payer: 59 | Admitting: Podiatry

## 2019-02-13 DIAGNOSIS — M722 Plantar fascial fibromatosis: Secondary | ICD-10-CM

## 2019-02-14 NOTE — Progress Notes (Signed)
Subjective:   Patient ID: Melinda [name], female   DOB: 38 y.o.   MRN: [id number]   HPI Patient states she is feeling dramatically better with mild discomfort only upon deep palpation   ROS      Objective:  Physical Exam  Neurovascular status intact with patient's left heel and arch doing much better with pain still present only upon deep palpation     Assessment:  Acute fasciitis-like symptomatology which is present left over right with patient dramatically improved currently     Plan:  H&P reviewed condition and recommended continued anti-inflammatory support therapy stretching exercises and if symptoms were to get worse we will need to see her back

## 2019-05-14 ENCOUNTER — Encounter: Payer: Self-pay | Admitting: Podiatry

## 2019-05-14 ENCOUNTER — Ambulatory Visit (INDEPENDENT_AMBULATORY_CARE_PROVIDER_SITE_OTHER): Payer: 59

## 2019-05-14 ENCOUNTER — Ambulatory Visit: Payer: 59 | Admitting: Podiatry

## 2019-05-14 ENCOUNTER — Other Ambulatory Visit: Payer: Self-pay

## 2019-05-14 ENCOUNTER — Other Ambulatory Visit: Payer: Self-pay | Admitting: Podiatry

## 2019-05-14 DIAGNOSIS — M722 Plantar fascial fibromatosis: Secondary | ICD-10-CM

## 2019-05-14 DIAGNOSIS — M79671 Pain in right foot: Secondary | ICD-10-CM

## 2019-05-14 NOTE — Progress Notes (Signed)
Subjective:   Patient ID: Melinda [name], female   DOB: 38 y.o.   MRN: [id number]   HPI Patient presents with pain in the mid arch area right with quite a bit of inflammation pain with palpation.  It is localized to this area and she states is been hurting for about 2 weeks and does not remember injury   ROS      Objective:  Physical Exam  Neurovascular status intact with exquisite discomfort in the mid arch area right distal to the insertion of the calcaneus.     Assessment:  Acute plantar fasciitis right mid arch     Plan:  H&P and conditions reviewed and today from the lateral side I injected the mid fascia 3 mg Kenalog 5 mg Xylocaine and applied fascial brace to lift up the arch.  Gave instructions on physical therapy anti-inflammatories and I did dispense a night splint to use to try to stretch the foot.  Reappoint to recheck  X-rays indicated flatfoot deformity but were negative for signs of fracture or bone pathology

## 2020-05-17 ENCOUNTER — Encounter: Payer: Self-pay | Admitting: Podiatry

## 2020-05-17 ENCOUNTER — Ambulatory Visit (INDEPENDENT_AMBULATORY_CARE_PROVIDER_SITE_OTHER): Payer: 59

## 2020-05-17 ENCOUNTER — Emergency Department (HOSPITAL_BASED_OUTPATIENT_CLINIC_OR_DEPARTMENT_OTHER): Payer: 59

## 2020-05-17 ENCOUNTER — Ambulatory Visit: Payer: 59 | Admitting: Podiatry

## 2020-05-17 ENCOUNTER — Other Ambulatory Visit: Payer: Self-pay

## 2020-05-17 ENCOUNTER — Emergency Department (HOSPITAL_BASED_OUTPATIENT_CLINIC_OR_DEPARTMENT_OTHER)
Admission: EM | Admit: 2020-05-17 | Discharge: 2020-05-17 | Disposition: A | Discharge status: Home / Self Care | Payer: 59 | Attending: Emergency Medicine | Admitting: Emergency Medicine

## 2020-05-17 ENCOUNTER — Encounter (HOSPITAL_BASED_OUTPATIENT_CLINIC_OR_DEPARTMENT_OTHER): Payer: Self-pay

## 2020-05-17 DIAGNOSIS — M722 Plantar fascial fibromatosis: Secondary | ICD-10-CM

## 2020-05-17 DIAGNOSIS — M79671 Pain in right foot: Secondary | ICD-10-CM | POA: Diagnosis not present

## 2020-05-17 DIAGNOSIS — R0789 Other chest pain: Secondary | ICD-10-CM | POA: Insufficient documentation

## 2020-05-17 DIAGNOSIS — M79672 Pain in left foot: Secondary | ICD-10-CM | POA: Diagnosis not present

## 2020-05-17 DIAGNOSIS — R079 Chest pain, unspecified: Secondary | ICD-10-CM | POA: Diagnosis present

## 2020-05-17 LAB — BASIC METABOLIC PANEL
Anion gap: 7 (ref 5–15)
BUN: 10 mg/dL (ref 6–20)
CO2: 24 mmol/L (ref 22–32)
Calcium: 8.7 mg/dL — ABNORMAL LOW (ref 8.9–10.3)
Chloride: 101 mmol/L (ref 98–111)
Creatinine, Ser: 0.85 mg/dL (ref 0.44–1.00)
GFR, Estimated: >60 mL/min (ref >60)
Glucose, Bld: 142 mg/dL — ABNORMAL HIGH (ref 70–99)
Potassium: 3.5 mmol/L (ref 3.5–5.1)
Sodium: 132 mmol/L — ABNORMAL LOW (ref 135–145)

## 2020-05-17 LAB — CBC
HCT: 38.8 % (ref 36.0–46.0)
Hemoglobin: 13 g/dL (ref 12.0–15.0)
MCH: 30.7 pg (ref 26.0–34.0)
MCHC: 33.5 g/dL (ref 30.0–36.0)
MCV: 91.7 fL (ref 80.0–100.0)
Platelets: 335 10*3/uL (ref 150–400)
RBC: 4.23 MIL/uL (ref 3.87–5.11)
RDW: 13.3 % (ref 11.5–15.5)
WBC: 6 10*3/uL (ref 4.0–10.5)
nRBC: 0 % (ref 0.0–0.2)

## 2020-05-17 LAB — TROPONIN I (HIGH SENSITIVITY): Troponin I (High Sensitivity): <2 ng/L (ref <18)

## 2020-05-17 MED ORDER — TRIAMCINOLONE ACETONIDE 10 MG/ML IJ SUSP
10.0000 mg | Freq: Once | INTRAMUSCULAR | Status: AC
  Administered 2020-05-17: 17:00:00 10 mg

## 2020-05-17 MED ORDER — DICLOFENAC SODIUM 75 MG PO TBEC
75.0000 mg | DELAYED_RELEASE_TABLET | Freq: Two times a day (BID) | ORAL | 2 refills | Status: DC
Start: 2020-05-17 — End: 2022-09-13

## 2020-05-17 NOTE — Discharge Instructions (Signed)
Take 4 over the counter ibuprofen tablets 3 times a day or 2 over-the-counter naproxen tablets twice a day for pain. Also take tylenol 1000mg(2 extra strength) four times a day.    

## 2020-05-17 NOTE — ED Provider Notes (Signed)
[facility] EMERGENCY DEPARTMENT Provider Note   CSN: [id number] Arrival date & time: 05/17/20  9767     History Chief Complaint  Patient presents with  . Chest Pain    Melinda [name] is a 39 y.o. female.  39 yo F with a cc of chest pain.  This is left-sided and sharp.  Worse with deep breathing.  Feels like a pressure across her chest is worse with breathing and movement and walking.  Seems to be better when she is in a seated position.  Started yesterday while she was watching TV.  There is some radiation down the arm.  Denies abdominal pain.  Denies cough congestion or fever.  Denies trauma.  Patient denies history of MI, denies hypertension hyperlipidemia diabetes or smoking.  Denies family history of MI.  Patient denies history of PE or DVT denies hemoptysis denies unilateral lower extremity edema denies recent surgery immobilization hospitalization estrogen use or history of cancer.   The history is provided by the patient.  Chest Pain Pain location:  L chest and L lateral chest Pain quality: dull and sharp   Pain radiates to:  Does not radiate Pain severity:  Moderate Onset quality:  Gradual Duration:  2 days Timing:  Constant Progression:  Worsening Chronicity:  New Relieved by:  Rest Worsened by:  Certain positions, deep breathing and movement Ineffective treatments:  None tried Associated symptoms: no dizziness, no fever, no headache, no nausea, no palpitations, no shortness of breath and no vomiting        History reviewed. No pertinent past medical history.  There are no problems to display for this patient.   History reviewed. No pertinent surgical history.   OB History   No obstetric history on file.     History reviewed. No pertinent family history.  Social History   Tobacco Use  . Smoking status: Never Smoker  . Smokeless tobacco: Never Used  Substance Use Topics  . Alcohol use: Not Currently  . Drug use: Not Currently     Home Medications Prior to Admission medications   Medication Sig Start Date End Date Taking? Authorizing Provider  naproxen sodium (ANAPROX) 220 MG tablet Take 440 mg by mouth 2 (two) times daily as needed (for pain or headache).    [provider]  omeprazole (PRILOSEC) 20 MG capsule Take 20 mg by mouth as needed. Takes for foot pain as needed    [provider]  valACYclovir (VALTREX) 1000 MG tablet Take 1,000 mg by mouth daily. 04/10/19   [provider]    Allergies    Meloxicam and Prednisone  Review of Systems   Review of Systems  Constitutional: Negative for chills and fever.  HENT: Negative for congestion and rhinorrhea.   Eyes: Negative for redness and visual disturbance.  Respiratory: Negative for shortness of breath and wheezing.   Cardiovascular: Positive for chest pain. Negative for palpitations.  Gastrointestinal: Negative for nausea and vomiting.  Genitourinary: Negative for dysuria and urgency.  Musculoskeletal: Negative for arthralgias and myalgias.  Skin: Negative for pallor and wound.  Neurological: Negative for dizziness and headaches.    Physical Exam Updated Vital Signs BP 98/86   Pulse 86   Temp 98.4 F (36.9 C) (Oral)   Resp 19   Ht 5\' 5"  (1.651 m)   Wt 108.9 kg   LMP 04/19/2020   SpO2 95%   BMI 39.94 kg/m   Physical Exam Vitals and nursing note reviewed.  Constitutional:  General: She is not in acute distress.    Appearance: She is well-developed and well-nourished. She is not diaphoretic.  HENT:     Head: Normocephalic and atraumatic.  Eyes:     Extraocular Movements: EOM normal.     Pupils: Pupils are equal, round, and reactive to light.  Cardiovascular:     Rate and Rhythm: Normal rate and regular rhythm.     Heart sounds: No murmur heard. No friction rub. No gallop.   Pulmonary:     Effort: Pulmonary effort is normal.     Breath sounds: No wheezing or rales.  Chest:     Chest wall: Tenderness  present.     Comments: Tenderness about the left anterior chest reproduces the patient's pain.  Worst about the lateral clavicular line about ribs 3 through 5.  Pain with range of motion of the left shoulder.  Pulse motor and sensation intact to the left upper extremity.  No appreciable edema. Abdominal:     General: There is no distension.     Palpations: Abdomen is soft.     Tenderness: There is no abdominal tenderness.  Musculoskeletal:        General: No tenderness or edema.     Cervical back: Normal range of motion and neck supple.  Skin:    General: Skin is warm and dry.  Neurological:     Mental Status: She is alert and oriented to person, place, and time.  Psychiatric:        Mood and Affect: Mood and affect normal.        Behavior: Behavior normal.     ED Results / Procedures / Treatments   Labs (all labs ordered are listed, but only abnormal results are displayed) Labs Reviewed  BASIC METABOLIC PANEL - Abnormal; Notable for the following components:      Result Value   Sodium 132 (*)    Glucose, Bld 142 (*)    Calcium 8.7 (*)    All other components within normal limits  CBC  TROPONIN I (HIGH SENSITIVITY)  TROPONIN I (HIGH SENSITIVITY)    EKG EKG Interpretation  Date/Time:  Monday May 17 2020 09:31:13 EST Ventricular Rate:  91 PR Interval:  156 QRS Duration: 82 QT Interval:  336 QTC Calculation: 413 R Axis:   14 Text Interpretation: Normal sinus rhythm Nonspecific T wave abnormality Abnormal ECG No old tracing to compare Confirmed by Melene Plan [phone]) on 05/17/2020 10:43:06 AM   Radiology DG Chest 2 View  Result Date: 05/17/2020 CLINICAL DATA:  Chest pain EXAM: CHEST - 2 VIEW COMPARISON:  None FINDINGS: Normal heart size. No pleural effusion or edema. No airspace opacities identified. The osseous visualized osseous structures are unremarkable. IMPRESSION: No active cardiopulmonary abnormalities. Electronically Signed   By: Signa Kell M.D.   On:  05/17/2020 09:58    Procedures Procedures (including critical care time)  Medications Ordered in ED Medications - No data to display  ED Course  I have reviewed the triage vital signs and the nursing notes.  Pertinent labs & imaging results that were available during my care of the patient were reviewed by me and considered in my medical decision making (see chart for details).    MDM Rules/Calculators/A&P                          39 yo F with a chief complaint of left-sided chest pain.  Is atypical in nature and reproduced on  exam.  EKG without concerning finding chest x-ray viewed by me without focal infiltrate or pneumothorax.  Troponin is negative.  Patient has had pain greater than 6 hours without any significant change in discomfort.  Feel delta is unnecessary.  Is PERC negative.  Discharge home.  Treat as musculoskeletal.  11:16 AM:  I have discussed the diagnosis/risks/treatment options with the patient and family and believe the pt to be eligible for discharge home to follow-up with PCP. We also discussed returning to the ED immediately if new or worsening sx occur. We discussed the sx which are most concerning (e.g., sudden worsening pain, fever, inability to tolerate by mouth) that necessitate immediate return. Medications administered to the patient during their visit and any new prescriptions provided to the patient are listed below.  Medications given during this visit Medications - No data to display   The patient appears reasonably screen and/or stabilized for discharge and I doubt any other medical condition or other [facility] requiring further screening, evaluation, or treatment in the ED at this time prior to discharge.   Final Clinical Impression(s) / ED Diagnoses Final diagnoses:  Atypical chest pain    Rx / DC Orders ED Discharge Orders    None       Melene Plan, DO 05/17/20 1116

## 2020-05-17 NOTE — Patient Instructions (Signed)

## 2020-05-17 NOTE — ED Triage Notes (Signed)
Pt reports L sided throbbing chest pain that comes and goes. Pt states it radiates into her L arm. Pt denies n/v or shortness of breath.

## 2020-05-19 ENCOUNTER — Other Ambulatory Visit: Payer: Self-pay | Admitting: Podiatry

## 2020-05-19 DIAGNOSIS — M722 Plantar fascial fibromatosis: Secondary | ICD-10-CM

## 2020-05-19 NOTE — Progress Notes (Signed)
Subjective:   Patient ID: Melinda [name], female   DOB: 39 y.o.   MRN: [id number]   HPI Patient states that she has developed a lot of pain in the bottom of her left heel and its been going on for several months and hard to walk on   ROS      Objective:  Physical Exam  Neurovascular status intact with exquisite discomfort plantar left at the insertional point of the tendon into the calcaneus     Assessment:  Acute plantar fasciitis left     Plan:  H&P reviewed condition and went ahead did sterile prep and injected the fascia 3 mg Kenalog 5 mg Xylocaine applied fascial brace with instructions on usage and reappoint to recheck.  Reviewed condition  X-rays indicate small spur no indication stress fracture or advanced arthritis

## 2020-05-27 ENCOUNTER — Other Ambulatory Visit: Payer: 59 | Admitting: Orthotics

## 2020-06-11 ENCOUNTER — Other Ambulatory Visit: Payer: Self-pay

## 2020-06-11 ENCOUNTER — Ambulatory Visit (INDEPENDENT_AMBULATORY_CARE_PROVIDER_SITE_OTHER): Payer: 59 | Admitting: Orthotics

## 2020-06-11 DIAGNOSIS — M722 Plantar fascial fibromatosis: Secondary | ICD-10-CM

## 2020-07-15 ENCOUNTER — Other Ambulatory Visit (HOSPITAL_COMMUNITY)
Admission: RE | Admit: 2020-07-15 | Discharge: 2020-07-15 | Disposition: A | Discharge status: Home / Self Care | Payer: 59 | Source: Ambulatory Visit | Attending: Family Medicine | Admitting: Family Medicine

## 2020-07-15 ENCOUNTER — Other Ambulatory Visit: Payer: Self-pay | Admitting: Family Medicine

## 2020-07-15 DIAGNOSIS — Z01411 Encounter for gynecological examination (general) (routine) with abnormal findings: Secondary | ICD-10-CM | POA: Diagnosis not present

## 2020-07-20 LAB — CYTOLOGY - PAP
Adequacy: ABSENT
Comment: NEGATIVE
Diagnosis: NEGATIVE
High risk HPV: NEGATIVE

## 2020-07-22 ENCOUNTER — Ambulatory Visit: Payer: 59 | Admitting: Orthotics

## 2020-07-22 ENCOUNTER — Other Ambulatory Visit: Payer: Self-pay

## 2020-07-22 DIAGNOSIS — M79672 Pain in left foot: Secondary | ICD-10-CM

## 2020-07-22 DIAGNOSIS — M79671 Pain in right foot: Secondary | ICD-10-CM

## 2020-07-22 NOTE — Progress Notes (Signed)
Patient picked up f/o and was pleased with fit, comfort, and function.  Worked well with footwear.  Told of rbeak in period and how to report any issues.  

## 2020-09-08 ENCOUNTER — Other Ambulatory Visit: Payer: Self-pay | Admitting: Family Medicine

## 2020-09-08 DIAGNOSIS — Z1231 Encounter for screening mammogram for malignant neoplasm of breast: Secondary | ICD-10-CM

## 2020-10-28 ENCOUNTER — Ambulatory Visit
Admission: RE | Admit: 2020-10-28 | Discharge: 2020-10-28 | Disposition: A | Discharge status: Home / Self Care | Payer: 59 | Source: Ambulatory Visit | Attending: Family Medicine | Admitting: Family Medicine

## 2020-10-28 ENCOUNTER — Other Ambulatory Visit: Payer: Self-pay

## 2020-10-28 DIAGNOSIS — Z1231 Encounter for screening mammogram for malignant neoplasm of breast: Secondary | ICD-10-CM

## 2020-11-02 ENCOUNTER — Other Ambulatory Visit: Payer: Self-pay | Admitting: Family Medicine

## 2020-11-02 DIAGNOSIS — R928 Other abnormal and inconclusive findings on diagnostic imaging of breast: Secondary | ICD-10-CM

## 2020-11-03 ENCOUNTER — Other Ambulatory Visit: Payer: Self-pay

## 2020-11-03 ENCOUNTER — Ambulatory Visit
Admission: RE | Admit: 2020-11-03 | Discharge: 2020-11-03 | Disposition: A | Discharge status: Home / Self Care | Payer: 59 | Source: Ambulatory Visit | Attending: Family Medicine | Admitting: Family Medicine

## 2020-11-03 ENCOUNTER — Ambulatory Visit: Payer: 59

## 2020-11-03 DIAGNOSIS — R928 Other abnormal and inconclusive findings on diagnostic imaging of breast: Secondary | ICD-10-CM

## 2020-12-07 NOTE — Progress Notes (Signed)
Patient was seen in office today for custom orthotic measurements by EJ with [facility]. Patient was advised that the office will call when the orthotics are available for pick-up. Patient verbalized understanding.  

## 2021-10-18 ENCOUNTER — Other Ambulatory Visit: Payer: Self-pay | Admitting: Family Medicine

## 2021-10-18 DIAGNOSIS — Z1231 Encounter for screening mammogram for malignant neoplasm of breast: Secondary | ICD-10-CM

## 2021-11-04 ENCOUNTER — Ambulatory Visit
Admission: RE | Admit: 2021-11-04 | Discharge: 2021-11-04 | Disposition: A | Discharge status: Home / Self Care | Payer: 59 | Source: Ambulatory Visit | Attending: Family Medicine | Admitting: Family Medicine

## 2021-11-04 DIAGNOSIS — Z1231 Encounter for screening mammogram for malignant neoplasm of breast: Secondary | ICD-10-CM

## 2022-07-12 IMAGING — CR DG CHEST 2V
2 series · 2 of 2 positions shown · non-contrast
Comparison: None

CLINICAL DATA: Chest pain

EXAM:
CHEST - 2 VIEW

[w chest pa *]
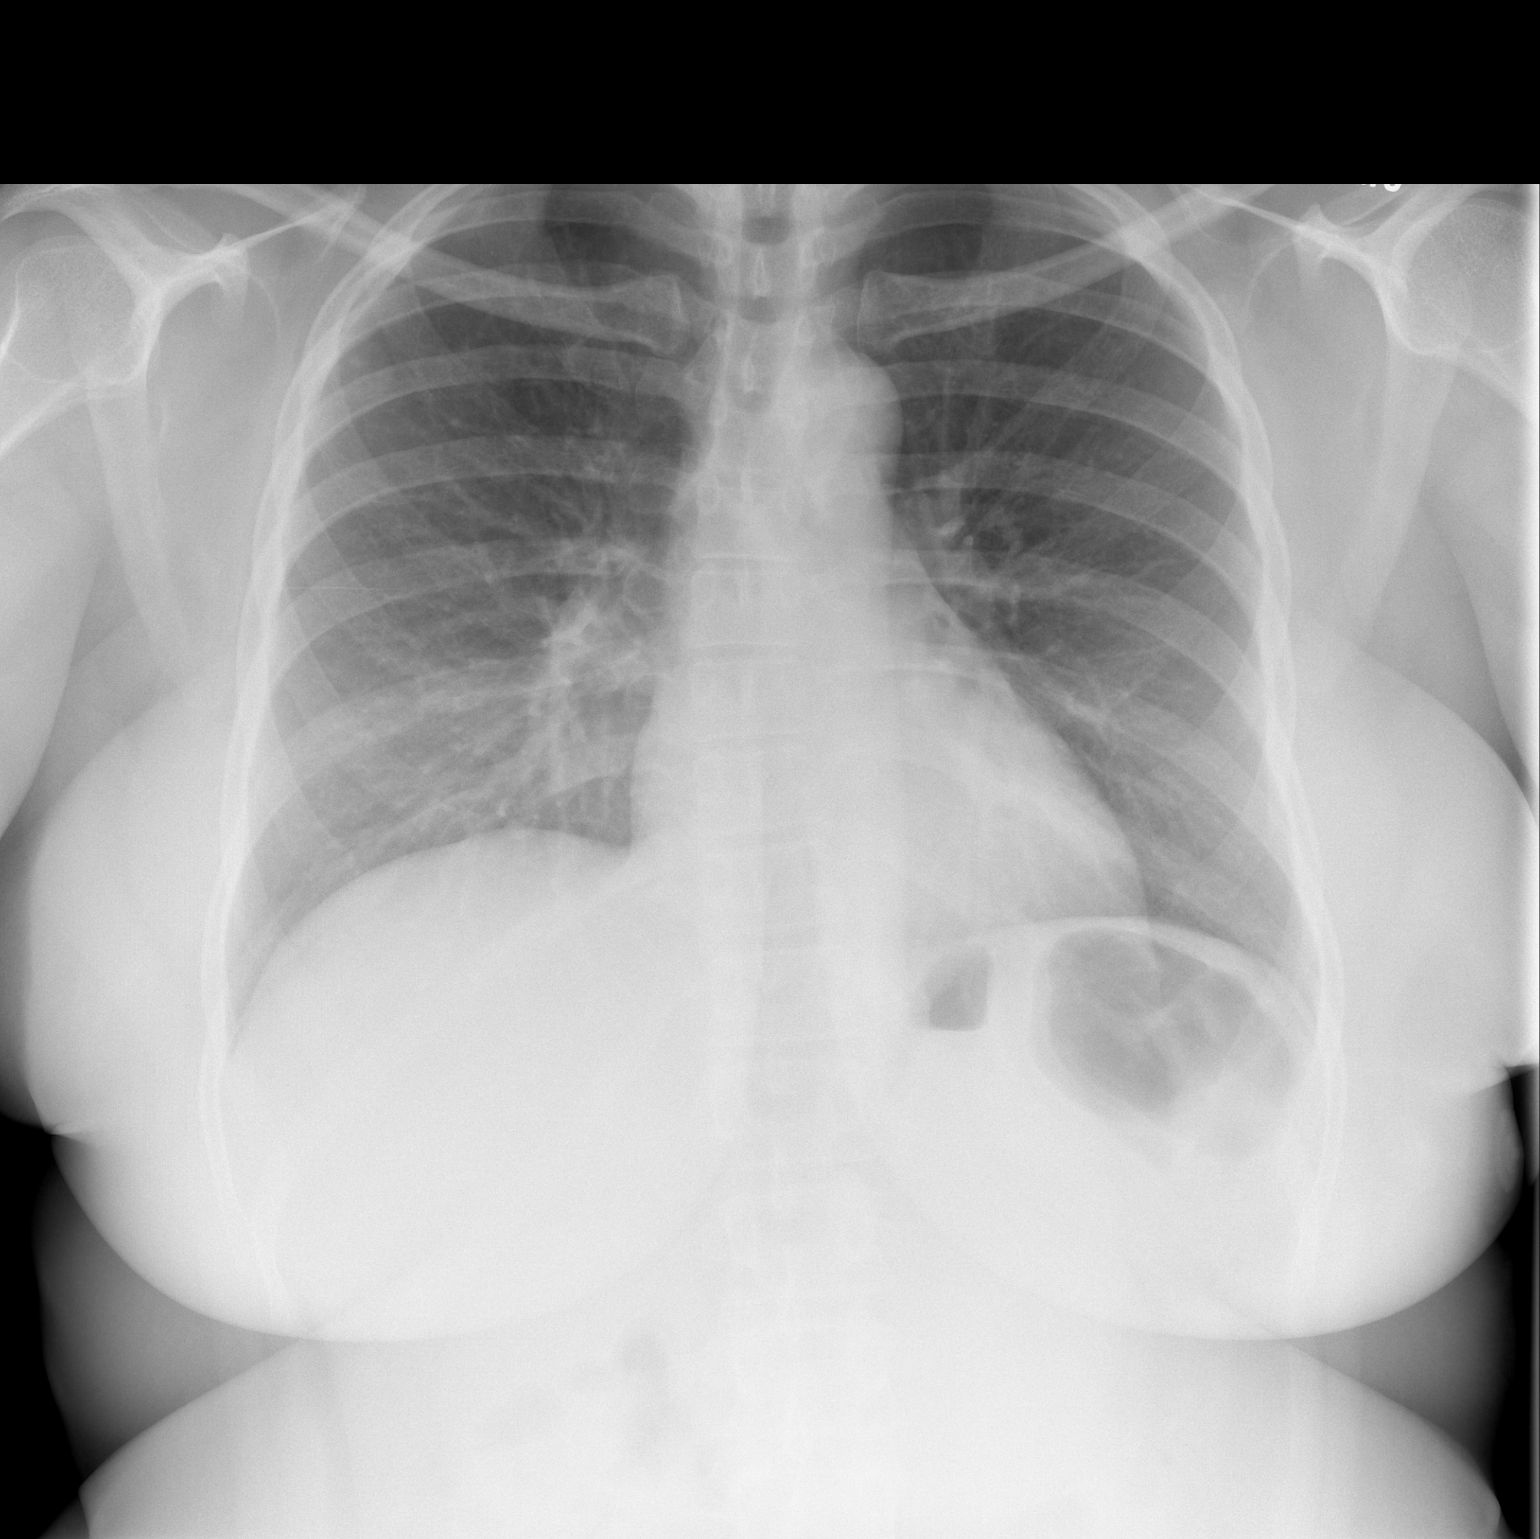

[w chest lat *]
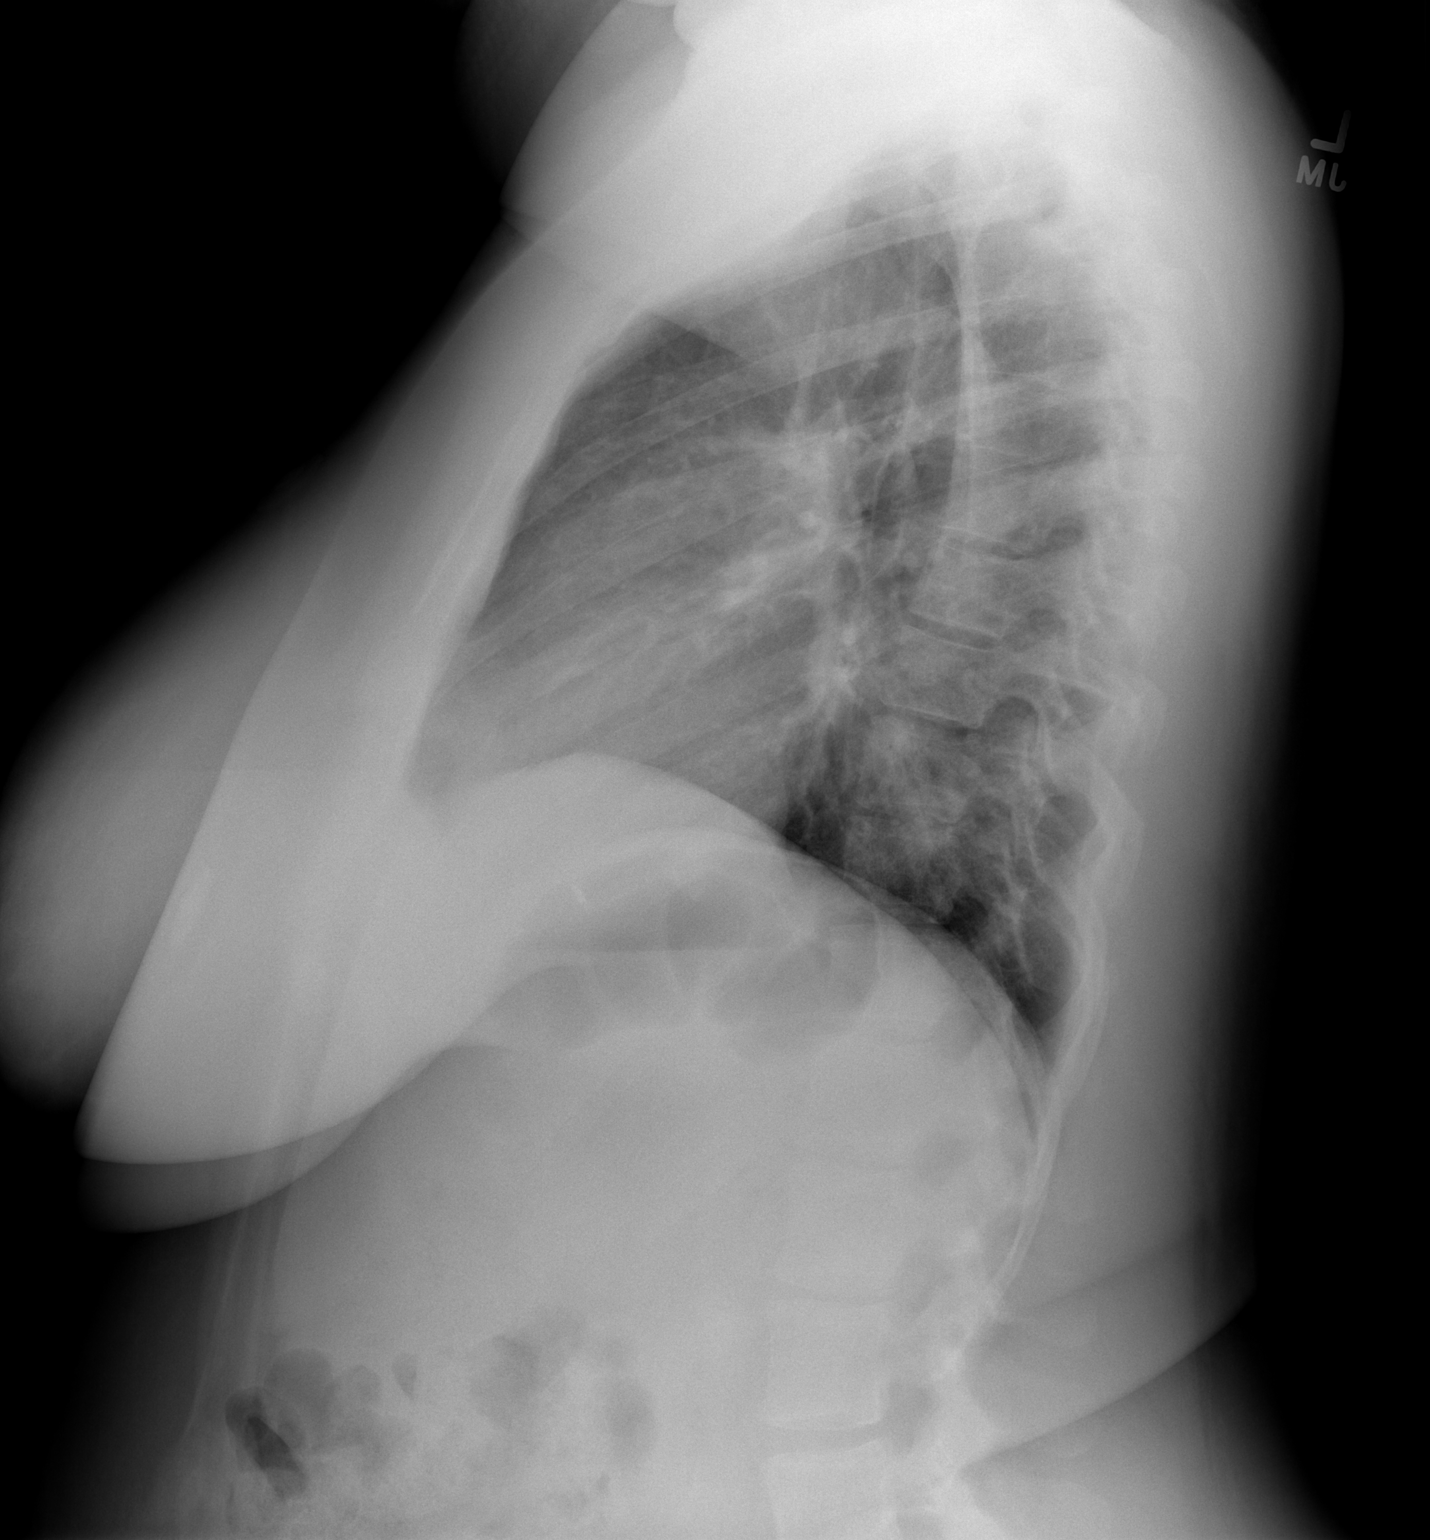

[2 of 2 positions shown; findings below may reference images not displayed]

FINDINGS: Normal heart size. No pleural effusion or edema. No airspace
opacities identified. The osseous visualized osseous structures are
unremarkable.
IMPRESSION: No active cardiopulmonary abnormalities.

## 2022-07-31 ENCOUNTER — Ambulatory Visit (INDEPENDENT_AMBULATORY_CARE_PROVIDER_SITE_OTHER): Payer: BC Managed Care – PPO | Admitting: Psychology

## 2022-07-31 ENCOUNTER — Encounter: Payer: Self-pay | Admitting: Psychology

## 2022-07-31 DIAGNOSIS — F3341 Major depressive disorder, recurrent, in partial remission: Secondary | ICD-10-CM | POA: Diagnosis not present

## 2022-07-31 DIAGNOSIS — F411 Generalized anxiety disorder: Secondary | ICD-10-CM | POA: Diagnosis not present

## 2022-07-31 DIAGNOSIS — F422 Mixed obsessional thoughts and acts: Secondary | ICD-10-CM | POA: Diagnosis not present

## 2022-07-31 NOTE — Progress Notes (Signed)
[facility] Counselor Initial Adult Exam  Name: Melinda [name] Date: 08/02/2022 MRN: [id number] DOB: 1980-09-25 PCP: Maurice Small, MD  Time spent: 3-3:50 pm  Guardian/Informant:  Fatima Sanger - patient    Paperwork requested: No  Met with patient for initial interview.  Patient was at home and session was conducted from therapist's office via video conferencing.  Patient verbally consented to telehealth.    Reason for Visit /Presenting Problem: Referred by Maurice Small, MD for ASD testing.  Patient interested in learning more about her thoughts and actions.  She has had certain questions her entire life that she hopes will be answered through testing.    Mental Status Exam: Appearance:   Neat and Well Groomed     Behavior:  Appropriate and Sharing  Motor:  Normal  Speech/Language:   Clear and Coherent and Normal Rate  Affect:  Appropriate and Full Range  Mood:  euthymic  Thought process:  normal mostly occasionally blocked  Thought content:    WNL  Sensory/Perceptual disturbances:    WNL  Orientation:  oriented to person, place, time/date, and situation  Attention:  Good  Concentration:  Good  Memory:  WNL  Fund of knowledge:   Good  Insight:    Good  Judgment:   Good  Impulse Control:  Good   Developmental History: Early delays - Struggled with handwriting.  Was in special education classes related to SLD in math and reading.  Didn't fit in social or understood most social situations.  Kept mostly to self during childhood.  Was bullied a lot during that time.    Motor - Adequate.  Walks and walks dog for exercise.  Cleans frequently at home.  No fine motor problems.   Speech - Adequate but her thoughts get jumbled at times. Self Care - Good Independent - Husband needs to do the bills.  Does well with chores (things need to have be in a specific place). Can do community activities when she structures her day and write activities down sos she does not get confused.     Social - current has 1-2 close friends for recreational outings, but still has trouble connecting to them emotionally.  Doesn't get their jokes.    Reported Symptoms: Sleeps ok most nights (as long as got what she needed accomplished).  No recent changes in appetite.  Good energy during the day.  No current sadness or depression but taking medication for that.  Had a recent episode of sadness 3 weeks ago after finding out a cousin passed away but no recent depression.  Sudden anxiety/panic.  Gets overwhelmed at work. Needs to go off by self to calm.  Bother by the noise coming from nearby fire station.  Takes medication for that as well.  Gets anxious when activities do not go according to plan.  Improved with medication.  Has general worry and social anxiety.  Avoids large gatherings.  Obsessive thought - dog running away (checks on him multiple times during the day).  Compulsive behavior - items have to be in specific places.  Excessive checking routine prior to leaving the house.  Has specific settings for car, upset when they are changed.  Has very specific work routines and places for her materials.  Very upset when they are changed.  Trouble paying attention for low interest activities only.  Easily distracted.  No losing but forgetful.  Good organization for patient but idiosyncratic.  Restless/fidgety and verbally and behaviorally  impulsive. Trouble interacting with  most peers.,  problems with jokes and sarcasm. Has a couple of casual friends but no close relations outside of family.  Will ask the same questions repeatedly but no repetitive speech or behavior.  Overly intense interest in WWII movies and documentaries.  Struggles with change and transition.  Overwhelmed but crowds.  Overly sensitive to noise (sirens, children screaming crying, repetitive barking.  Wears air pods when goes out in public to dampen noise.           Risk Assessment: Danger to Self:  Not currently - last time first week of  January - no attempts ever.    Self-injurious Behavior: No Bites inside of jaw for comfort.  Has done since childhood and has resulted in bleeding wearing of lining inside of jaw.   Danger to Others: No Duty to Warn:no Physical Aggression / Violence:No  Impatient with others but not aggressive Access to Firearms a concern: No  Gang Involvement:No  Patient / guardian was educated about steps to take if suicide or homicide risk level increases between visits: yes While future psychiatric events cannot be accurately predicted, the patient does not currently require acute inpatient psychiatric care and does not currently meet [facility] involuntary commitment criteria.  Substance Abuse History: Current substance abuse: No     Past Psychiatric History:   Previous psychological history is significant for anxiety, depression, learning disability, and OCD Outpatient Providers:Seeing a therapist since October 2022.  Medication followed by PCP.  History of Psych Hospitalization: No  Psychological Testing:  None    Abuse History:  Victim of: No.,  None    Report needed: No. Victim of Neglect:No. Perpetrator of  None   Witness / Exposure to Domestic Violence: No   Protective Services Involvement: No  Witness to [employer] Violence:  No   Family History:  Family History  Problem Relation Age of Onset   Breast cancer Maternal Grandmother   Has cousins with unspecified mental health conditions.  No one in immediate family.    Living situation: the patient lives with their spouse and 3 sons and dog.    Sexual Orientation: Straight  Relationship Status: married 19 years.  Adequate relations with husband.  Wishes for more structure, communication, organization, and time together  Name of spouse / other:Terry Pofahl If a parent, number of children / ages:20, 8, 15  Support Systems: parents (mother)   Financial Stress:  No   Income/Employment/Disability: Employment - [employer]. 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  Does well on the job but has a strict routine and get frustrated when things are moved or she doesn't know how to resolve a certain situation.      Military Service: No   Educational History: Education: some college Had learning issues during childhood.  College became too difficult.  Was not mature enough.  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    Any cultural differences that may affect / interfere with treatment:  Black/AA  Recreation/Hobbies: traveling, audio books (trouble reading), murder mysteries, collecting books.  WWII Research scientist (medical) and movies.  Walking, looking at flowers.     Stressors: Other: being around crowds, not understanding what she is doing, not finishing her work on time.       Strengths: Helping others, getting job done.  Barriers:  Not understanding fully what is being asked of her.  People have to repeat what they say (doesn't understand or gets distracted).  Legal History: Pending legal issue / charges: The patient has no significant history of legal issues.  Medical History/Surgical History: reviewed History reviewed. No pertinent past medical history.  History reviewed. No pertinent surgical history.  Medications: Current Outpatient Medications  Medication Sig Dispense Refill   diclofenac (VOLTAREN) 75 MG EC tablet Take 1 tablet (75 mg total) by mouth 2 (two) times daily. 50 tablet 2   naproxen sodium (ANAPROX) 220 MG tablet Take 440 mg by mouth 2 (two) times daily as needed (for pain or headache).     omeprazole (PRILOSEC) 20 MG capsule Take 20 mg by mouth as needed. Takes for foot pain as needed     valACYclovir (VALTREX) 1000 MG tablet Take 1,000 mg by mouth daily.     No current facility-administered medications for this visit.  Current - Citalopram, Lamotrigine  Allergies  Allergen Reactions   Meloxicam     Pt stated, "This made me  sleepy"   Prednisone     Pt stated, "It made me feel different; I felt jittery and impatient"  No other allergies, no digestion problems, no concussions or head injuries.      Diagnoses:  Mixed obsessional thoughts and acts  Generalized anxiety disorder  Recurrent major depressive disorder, in partial remission ([place])  Plan of Care: Patient presents with a history of anxiety, depressed mood, and Obsessive-compulsive behavior.  Patient also reported having difficulty with distractibility, learning, understanding others, impulsivity, social interaction, overly intense interests, resistance to change, and sensory hypersensitivity.  Testing recommended to evaluate for Autism spectrum disorder, ADHD, and other conditions that may be affecting attention, social interaction, and behavior.     Test Battery - In person K-BIT-2, CNSVS, BRIEF-A, Adult ADHD, Adult OCD, DASS, ADOS-2 Module 4, SRS-2 (S & O).   Rainey Pines, PhD

## 2022-07-31 NOTE — Progress Notes (Signed)
                Zurisadai Helminiak, PhD 

## 2022-08-22 ENCOUNTER — Ambulatory Visit (INDEPENDENT_AMBULATORY_CARE_PROVIDER_SITE_OTHER): Payer: Self-pay | Admitting: Psychology

## 2022-08-22 ENCOUNTER — Encounter: Payer: Self-pay | Admitting: Psychology

## 2022-08-22 DIAGNOSIS — F411 Generalized anxiety disorder: Secondary | ICD-10-CM

## 2022-08-22 DIAGNOSIS — F3341 Major depressive disorder, recurrent, in partial remission: Secondary | ICD-10-CM

## 2022-08-22 DIAGNOSIS — F422 Mixed obsessional thoughts and acts: Secondary | ICD-10-CM

## 2022-08-22 NOTE — Progress Notes (Signed)
                Krishiv Sandler, PhD 

## 2022-08-22 NOTE — Progress Notes (Signed)
[place] Counselor/Therapist Progress Note  Patient ID: Melinda [name], MRN: [id number],    Date: 08/22/2022  Time Spent: 12:00 - 2:00pm   Treatment Type: Testing  Met with patient for testing session.  Patient was at the clinic and session was conducted from therapist's office in person.    Reported Symptoms/Reason for Referral:  Patient presents with a history of anxiety, depressed mood, and Obsessive-compulsive behavior.  Patient also reported having difficulty with distractibility, learning, understanding others, impulsivity, social interaction, overly intense interests, resistance to change, and sensory hypersensitivity.  Testing recommended to evaluate for Autism spectrum disorder, ADHD, and other conditions that may be affecting attention, social interaction, and behavior.     Mental Status Exam: Appearance:  Casual and Neatly Groomed     Behavior: Appropriate  Motor: Normal  Speech/Language:  Clear and Coherent and Normal Rate  Affect: Appropriate  Mood: Anxious  Thought process: normal  Thought content:   WNL  Sensory/Perceptual disturbances:   WNL  Orientation: oriented to person, place, time/date, and situation  Attention: Fair  Concentration: Good  Memory: WNL  Fund of knowledge:  Fair  Insight:   Fair  Judgment:  Good  Impulse Control: Good   Risk Assessment: Danger to Self:  No Self-injurious Behavior: No Danger to Others: No Duty to Warn:no Physical Aggression / Violence:No   Subjective: Testing included the K-BIT-2 (0.75 hrs. for testing and scoring) along with the CNS Vital signs (0.75 hrs.) and BRIEF-A (0.5 hrs.).  Husband was sent the SRS-2 to complete online prior to next session.    Patient was cooperative and displayed good effort. Attention and concentration were adequate overall, although patient exhibited multiple instances of self-correction and asking questions to be repeated, leading to an inconsistent performance overall.  Mood  was anxious with appropriate affect, with patient becoming overwhelmed at one point when she did not know an answer.  The results appear representative of current functioning.    Diagnosis:Mixed obsessional thoughts and acts  Generalized anxiety disorder  Recurrent major depressive disorder, in partial remission ([place])  Plan: Testing to continue next session with the ADOS 2 Module 4  and SRS-2 Self report followed by report writing and interactive feedback.     Rainey Pines, PhD

## 2022-08-23 ENCOUNTER — Ambulatory Visit: Payer: BC Managed Care – PPO | Admitting: Psychology

## 2022-08-23 ENCOUNTER — Encounter: Payer: Self-pay | Admitting: Psychology

## 2022-08-23 DIAGNOSIS — F3341 Major depressive disorder, recurrent, in partial remission: Secondary | ICD-10-CM

## 2022-08-23 DIAGNOSIS — F422 Mixed obsessional thoughts and acts: Secondary | ICD-10-CM

## 2022-08-23 DIAGNOSIS — F411 Generalized anxiety disorder: Secondary | ICD-10-CM

## 2022-08-23 NOTE — Progress Notes (Signed)
[place] Counselor/Therapist Progress Note  Patient ID: Melinda [name], MRN: [id number],    Date: 08/23/2022  Time Spent: 12:00 - 2:00pm   Treatment Type: Testing  Met with patient for testing session.  Patient was at the clinic and session was conducted from therapist's office in person.    Reported Symptoms/Reason for Referral:  Patient presents with a history of anxiety, depressed mood, and Obsessive-compulsive behavior.  Patient also reported having difficulty with distractibility, learning, understanding others, impulsivity, social interaction, overly intense interests, resistance to change, and sensory hypersensitivity.  Testing recommended to evaluate for Autism spectrum disorder, ADHD, and other conditions that may be affecting attention, social interaction, and behavior.     Mental Status Exam: Appearance:  Casual and Neatly Groomed     Behavior: Appropriate  Motor: Normal  Speech/Language:  Clear and Coherent and Normal Rate  Affect: Appropriate  Mood: Euthymic  Thought process: normal  Thought content:   WNL  Sensory/Perceptual disturbances:   WNL  Orientation: oriented to person, place, time/date, and situation  Attention: Fair  Concentration: Good  Memory: WNL  Fund of knowledge:  Fair  Insight:   Fair  Judgment:  Good  Impulse Control: Good   Risk Assessment: Danger to Self:  No Self-injurious Behavior: No Danger to Others: No Duty to Warn:no Physical Aggression / Violence:No   Subjective: Testing included the ADOS 2 Module 4 (1.75 hrs. for testing and scoring) along with the SRS-2 (0.25 hrs.).  Husband yet to complete the SRS-2.    Patient was cooperative and displayed good effort. Attention and concentration were adequate overall, although patient exhibited multiple instances of self-correction and asking questions to be repeated, leading to an inconsistent performance overall.  Mood was anxious with appropriate affect, with patient becoming  overwhelmed at one point when she did not know an answer.  Patient was socially responsive and expressive but mostly about her own interests.  She showed several instances of compulsive/ritualistic behavior.  The results appear representative of current functioning.    Diagnosis:Mixed obsessional thoughts and acts  Generalized anxiety disorder  Recurrent major depressive disorder, in partial remission ([place])  Plan: Testing complete pending return of rating form from husband. Report writing to be conducted followed by interactive feedback next session.    Rainey Pines, PhD                 Rainey Pines, PhD

## 2022-08-24 ENCOUNTER — Encounter: Payer: Self-pay | Admitting: Psychology

## 2022-08-24 NOTE — Progress Notes (Signed)
Melinda [name] is a 42 y.o. female patient Report writing competed ( 2 hrs.).  Interactive feedback to be conducted next session. Report to be attached to the feedback progress note.  Patient/Guardian was advised Release of Information must be obtained prior to any record release in order to collaborate their care with an outside provider. Patient/Guardian was advised if they have not already done so to contact the registration department to sign all necessary forms in order for [place] to release information regarding their care.   Consent: Patient/Guardian gives verbal consent for treatment and assignment of benefits for services provided during this visit. Patient/Guardian expressed understanding and agreed to proceed.    Rainey Pines, PhD

## 2022-08-29 ENCOUNTER — Encounter: Payer: Self-pay | Admitting: Psychology

## 2022-08-29 ENCOUNTER — Ambulatory Visit: Payer: BC Managed Care – PPO | Admitting: Psychology

## 2022-08-29 DIAGNOSIS — F840 Autistic disorder: Secondary | ICD-10-CM

## 2022-08-29 DIAGNOSIS — F422 Mixed obsessional thoughts and acts: Secondary | ICD-10-CM | POA: Diagnosis not present

## 2022-08-29 DIAGNOSIS — F902 Attention-deficit hyperactivity disorder, combined type: Secondary | ICD-10-CM | POA: Diagnosis not present

## 2022-08-29 NOTE — Progress Notes (Signed)
Psychological Testing Report - Confidential  Identifying Information:               Patient's Name:   VICKYE GOLEN  Date of Birth:   Oct 28, 1980     Age:                42 years  MRN#:                                   [id number]      Dates of Assessment:  March 19 & 20, 2024         Purpose of Evaluation:  The purpose of the evaluation is to provide diagnostic information and treatment recommendations.  Ms. Steuerwald was the primary informant for this evaluation.   Referral Information: Ms. Volle was a 42 year old Black female referred for testing by Maurice Small, MD related to suspicions of a neurodevelopmental disorder.  Ms. Matza reported being interested in learning more about her thoughts and actions.  She has had certain questions her entire life that she hopes will be answered through testing.    Relevant Background Information:  Developmental history was reported to be significant for delays for early development.  Ms. Hamideh reported struggling with handwriting.  She was in special education classes related to Specific Learning Deficits in math and reading.  She didn't fit in socially or understand most social situations.  Ms. Leaverton kept mostly to herself during childhood and was bullied much during that time.  Regarding current development.  Motor skills were described as adequate.  Ms. Rigmaiden walks by herself and walks her dog for exercise.  She also gets movement by frequently cleaning her home.  Fine motor problems were denied.  Regarding speech, Ms. Sill indicated that she speaks adequately but her thoughts become jumbled at times.  Self-care was reported to be good.  Independent activity was reported to be adequate for most activities, but her husband needs to do the bills.  She does well completing household chores, as household items need to have be in a specific place.  She can do community activities when she structures her day and write activities down, so she does not get confused.  Socially,  Ms. Crivelli indicated currently having 1-2 close friends for recreational outings but has trouble connecting to them emotionally.  She often doesn't get their jokes.      Medical history was reported to be unremarkable for chronic physical health problems.   Allergies to Meloxicam and Prednisone were reported while a history of concussions, head injuries or seizures was denied.  Medication history includes Diclofenac (VOLTAREN) 75 MG, Naproxen Sodium (ANAPROX) 220 MG, Omeprazole (PRILOSEC) 20 MG, And Valacyclovir (VALTREX) 1000 MG with current medications consisting of Citalopram and Lamotrigine.  Current substance use was denied.  Psychological history is significant for anxiety, depression, learning disability, and Obsessive-Compulsive Disorder (OCD).  Ms. Dornbusch has been seeing a psychotherapist since October 2022.  He medication is being monitored by her Primary Care Physician Dr. Justin Mend.  Psychiatric hospitalization and previous psychological testing were denied.  Educationally, Ms. Offord reported that she completed some college courses but did not earn her degree.  She reported having learning issues during childhood and college became too difficult.  She struggled throughout educational tenure and did not have the support she needed at the [employer] level.  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  She reported performing well on the job but has a strict structure/routine, although she becomes frustrated when work items are moved or when an unexpected situation occurs.  She previously worked for [employer] but found that job emotionally overwhelming due to the high frequency of hardship cases.  Ms. Juenemann reported enjoying her current job and wanting to keep it for the foreseeable future.  Leisure activities include traveling, listening to audio books, and collecting books.  She specific enjoys murder mysteries, World War II  documentaries, and historical movies.  She also enjoys walking and looking at flowers.    Ms. Beath is currently living with her husband, Deedie Strawther, 3 sons (ages 58, 68, and 72), and her dog.  She has been married 19 years and reported having adequate marital relations with her husband, wishing for more structure, communication, organization, and time together. Ms. Sonne reported receiving much emotional support from her mother.  Family mental health history was reported to be significant for having cousins with unspecified mental health conditions.  Childhood/adolescent history was reported to be stable without significant trauma, although her learning deficits and bullying she received were difficult for her.  Current stressors include being around crowds, not understanding what she is doing, and not finishing her work on time.  Barriers to success consist of Ms. Clint not understanding fully what is being asked of her.  People must repeat what they say to her as she often doesn't understand or gets distracted.  Strengths consist of helping others and completing assigned tasks.      Presenting Symptomology:  Ms. Hegland reported that she sleeps adequately most nights, provided she accomplished what she needed to do that day.  Recent changes in appetite were denied and good energy was reported during the day.  Ms. Brizzolara denied current sadness or depression but is taking medication for that.  She had a recent episode of sadness 3 weeks ago after finding out a cousin passed away but has not had any recent bouts of depression.  Episodes of sudden anxiety/panic were reported.  Ms. Holk indicted becoming overwhelmed at work and needing to go off by herself to calm at times.  She also gets triggered by the noise coming from a nearby fire station.  Ms. Hobbie takes medication for her anxiety as well.  She becomes anxious when activities do not go according to plan along with having general worry and social anxiety.  She typically  avoids large gatherings.  Obsessive/intrusive thought includes her dog running away, leading to her checking on him multiple times during the day.  Other compulsive behaviors include items having to be in specific places and an excessive checking routine prior to leaving the house.  She has specific settings for her car and becomes, upset when they are changed.  She also has very specific work routines and places for her materials.  Ms. Luan reported having trouble paying attention for low interest activities only.  She gets easily distracted and is often forgetful but does not typically lose items.  Her organization was described as good for her but idiosyncratic.  She is frequently restless/fidgety with verbal and behavioral impulsivity. Ms. Facey reported having trouble interacting with most peers.  She has problems understanding jokes and sarcasm.  Ms. Denapoli reported having a couple of casual friends but no close relations outside of her family.  She will ask the same questions repeatedly but does not engage in repetitive speech  or behavior.  Overly intense interests include World War II movies and documentaries.  She struggles with change and transition and becomes   overwhelmed in crowds.  She reported being overly sensitive to noise including sirens, children screaming/crying, and repetitive barking.  Ms. Mazzola wears air pods when she goes out in public to dampen the noise.                                                          Procedures Administered: Terie Purser 7  9 Client: Aylana L. Argeta       DOB:  [birth year]         MRN# [id number] [place], [place], [address] Brief Intelligence Test - 2 CNS Vital Signs 7  9 Client: Seryna L. Gillogly       DOB:  [birth year]         MRN# [id number] [place], [place], [address] Behavior Rating Inventory for Executive Function - Adult Self Report 7  9 Client: Aalaiyah L. Pruitte        DOB:  1980/11/11         MRN# [id number] [place], [place], [address] Adult ADHD Self Report Scale 7  9 Client: Shetara L. Susalla       DOB:  May 26, 1981         MRN# [id number] [address], [place], [address] Adult OCD Inventory 7  9 Client: Kerryann L. Grenda       DOB:  1981/04/23         MRN# [id number] [place], [place], [address] Depression, Anxiety, and Stress Scale7  9 Client: Kamisha L. Bury       DOB:  [birth year]         MRN# [id number] [place], [place], [address]  Autism Diagnostic Observation Schedule 2 Module 4 7  9  Client: Tekesha L. Carkhuff       DOB:  March 24, 1981         MRN# [id number] [place], [place], [address] Social Responsiveness Scale 2 7  9  Client: Takeko Mcnicol. Alavez       DOB:  1981-05-10         MRN# [id number] [place], [place], [address] - Self and Other Report  Behavioral Observations:  Ms. Gudmundson was cooperative and displayed good effort. Attention and concentration were adequate overall, although Ms. Wallgren exhibited multiple instances of self-correction and asking questions to be repeated, leading to an inconsistent performance overall.  Mood was anxious with appropriate affect, with Ms. Bufano becoming overwhelmed at one point when she did not know an answer.  She was socially responsive and expressive but mostly about her own interests.  She showed several instances of compulsive/ritualistic behavior.  The results appear representative of current functioning.  Ms. Loewer was appropriately dressed and adequately groomed.  Brief mental status indicated typical general orientation and alertness.  Memory appeared below typical.  Knowledge and insight were fair, while judgement was good.  Hallucinations, delusions, and  thoughts of self-harm were denied.    Test Results and Interpretation:   General Intellectual Functioning:  The K-BIT 2 was used to assess Ms. Mcnemar performance across two areas of cognitive ability.  Ms. Kundu Composite IQ score fell within the low average range when compared to same age peers (CIQ = 31).  Ms. Romig performance was relatively inconsistent across the Primary Index Scores, as Perceptual Reasoning (PRI = 94) was average and better developed than Verbal Comprehension (VCI = 84, below average).  The difference was statistically significant.  This indicates age typical and greater visual learning ability than language understanding, which was below age typical.  On individual subtests, Ms. Okland performed within the low average range for verbal knowledge and inferential thinking (riddles), with average visual pattern analysis (matrices).  The differences between the verbal knowledge and riddles scores were not statistically significant.      Terie Purser Brief Intelligence Test - 2 Composite Score Summary  Composite Scores  Sum of Raw Scores Composite Score Percentile Rank 90% Confidence Interval Qualitative Description  Verbal Comprehension  VC          78    84  14  80-90  Below Average  Nonverbal Reasoning NVR 35   94 34 88-101 Average  Composite IQ  FSIQ -   87 19 83-92 Low Average    Domain Subtest Name  Total Raw Score Scaled Score Percentile Rank  Verbal Verbal Knowledge VK 47   7 16  Comprehension Riddles Ri 31   7 16          Attention and Processing:  The results of the CNS Vital Signs testing indicated low overall neurocognitive processing ability, at a level relatively below measured comprehension ability (low average).  Regarding areas related to attention problems, simple attention was extremely low while sustained attention, complex attention, executive function, and cognitive flexibility were low.  These are the domains most closely associated with attention  deficits.  Motor speed was average while psychomotor speed was below average, with low processing speed and below average reaction time, indicating below age typical thinking speed with slow coordinated movement and much hesitancy in responding on computerized measures.  Visual memory was extremely low while verbal memory was low, indicating impaired ability to remember words and images.  Working Marine scientist (used for multi-tasking) was very low.  Identifying emotional expression was well below age typical age Ms. Tolsma's score for social acuity was extremely low.  The results suggest that Ms. Zaluski appears to have poor ability attending to simple activities with impairment regarding sustaining attention, shifting attention, attending systematically, and multitasking.  Her slow movement and responsiveness could be related to either depressed mood, anxiety, or fine motor delays, although motor speed without cognition was average.  The validity scales indicated a valid profile for all measures, except for social acuity, working memory, and sustained attention.                                                            CNS Vital Signs   Domain Scores Standard Score %ile Validity Indicator Guideline  Neurocognitive Index 74 4 Yes Low  Composite Memory 57 <1 Yes Extremely Low  Verbal Memory 72 3 Yes Low  Visual Memory 58 <1 Yes Extremely Low  Psychomotor speed 83 13 Yes Below Average  Reaction Time 82 12 Yes Below Average  Complex Attention 71 3 Yes Low  Cognitive Flexibility 76 5 Yes Low  Processing Speed 77 6 Yes Low  Executive Function 77 6 Yes Low  Social Acuity 1 <1 No Extremely Low  Working Memory 68 2 No Very Low  Sustained Attention 74 4 No Low  Simple Attention  57 <1 Yes Extremely Low  Motor Speed 91 27 Yes Average   Executive Function: Ms. Dhanani completed the Self-Report Form of the Behavior Rating Inventory of Executive Function-Adult Version (BRIEF-A) on 08/22/2022. There are no missing item  responses in the protocol. Ratings of Ms. Kocian self-regulation do not appear overly negative. Items were completed in a reasonable fashion, suggesting that the respondent did not respond to items in a haphazard or extreme manner. Responses are reasonably consistent. In the context of these validity considerations, ratings of Ms. Grubb everyday executive function suggest some areas of concern. The overall index, the Global Executive Composite (GEC), was elevated (GEC T = 90, %ile = >99). Both the Behavioral Regulation (BRI) and the Metacognition (MI) Indexes were elevated (BRI T = 91, %ile = >99 and MI T = 84, %ile = >99).      Within these summary indicators, all the individual scales are valid. All the individual BRIEF-A scales were elevated, suggesting that Ms. Hayer reports difficulty with all aspects of executive function. Concerns are noted with her ability to inhibit impulsive responses, adjust to changes in routine or task demands, modulate emotions, monitor social behavior, initiate problem solving or activity, sustain working memory, plan and organize problem-solving approaches, attend to task-oriented output, and organize environment and materials.  Ms. Olinski scores on the Shift and Emotional Control scales are elevated compared to age-matched peers. This profile suggests significant problem-solving rigidity combined with emotional dysregulation. Individuals with this profile tend to lose emotional control when their routines or perspectives are challenged and/or flexibility is required.  Additionally, Ms. Janson elevated scores on the Inhibit scale, and the Behavioral Regulation and the Metacognition Indexes, suggest that Ms. Ludy is perceived as having poor inhibitory control and/or suggest that more global behavioral dysregulation is having a negative effect on active metacognitive problem solving.   BRIEF-A Score Summary Table Scale/Index Raw score T score Percentile  Inhibit 20 79 99  Shift 15 79  >99  Emotional Control 29 86 >99  Self-Monitor 17 87 >99  Behavioral Regulation Index (BRI) 81 91 >99  Initiate 21 83 >99  Working Memory 22 90 >99  Plan/Organize 23 76 99  Task Monitor 15 80 >99  Organization of Materials 18 68 96  Metacognition Index (MI) 99 84 >99  Global Executive Composite (GEC) 180 90 >99   Behavioral - Emotional Functioning: Ratings of behavior and emotional functioning indicated significant attention problems and behavior regulation difficulty.  On the Adult Attention Deficit Hyperactivity Disorder (ADHD) Self Report Scale, Ms. Heick positively endorsed, as occurring often or very often, 6 of 9 items for inattention/poor organization and 6 of 9 items for hyperactivity and poor impulse control.  Additionally, 4 of 6 critical items were highly endorsed including difficulty starting and completing projects, with frequent fidgeting and feeling compelled to move.  Endorsement of at least 5 items in either category along with 4 critical items is considered at-risk for ADHD.    Ratings for general emotional functioning indicated clinically significant emotional distress as Ms. Truman Hayward' responses on the Depression, Anxiety and Stress Scale indicated severe levels of depression, anxiety, and stress with 18 of the 21 items highly endorsed.  High endorsement was noted on 12 of  the 21 individual items.  Additionally, moderate impairment was noted on the Adult OCD Inventory with 15 of the 20 items highly endorsed.    Regarding symptoms of ASD, information from the ADOS 2 Module 4 indicated difficulty with several aspects of reciprocal social communication and interaction observed during this administration, with some observance of restricted repetitive behavior.  Within the area of communication, Ms. Stansberry spoke in complete sentences.  There was typical variation in her tone of voice and there was no observation of echolalia or atypical repetitive speech.  She was able to adequately report  routine and nonroutine events and offered frequent spontaneous personal information.  Ms. Pickron responded adequately to personal information from the examiner and occasionally asked socially related questions.  Reciprocal conversation appeared typically developed for her age and cognitive level.  She demonstrated frequent use of spontaneous descriptive gestures with occasional demonstration of emphatic or emotional gestures.  Socially, more difficulty was noted as Ms. Foto exhibited difficulty sustaining eye contact.  Her range of facial expression seemed typical, but she rarely directed facial expression to the examiner.  She demonstrated much expressed pleasure interacting during the evaluation.  Ms. Fradella exhibited adequate ability elaborating on personal emotions and showed good ability spontaneously and accurately identify emotions in others during pictures or stories.  Ms. Deily demonstrated adequate insight into the nature of social relationships and could described her own role in these relationships.  Her awareness of or ability to complete age typical levels of responsibility seemed appropriate for most areas except for managing money.  Ms. Hagelstein frequently initiated social interaction and responding to social advances from the examiner, but they were primarily about her own interests.  Social rapport was adequately established as Ms. Halleck seemed comfortable throughout this portion of the testing.  Ms. Gacia demonstrated a high level of creative object use and elaborate story telling.  Regarding behavior, Ms. Gittens demonstrate some odd sensory seeking behavior, closely staring at certain objects.  Odd movement and self-injury were not observed.  Frequent compulsive behavior was noted (toothbrushing routine, placement of items, marker caps needing to be lined up, and balls on the pinball machine needing to be in certain spaces).  While atypical interests (World War II documentaries and graveyards) were mentioned, they  were typically within the context of the conversation.         Current functioning regarding ASD related symptoms reported during daily activities was assessed using the Social Responsiveness Scale - 2.  Ms. Kasparian was the informant for this measure.  On this measure, Ms. Boggs' T Score of 89 was in Severe range. Scores in this range indicate deficiencies in reciprocal social behavior that are clinically significant and lead to severe and enduring interference with everyday social interactions.  Such scores are strongly associated with clinical diagnosis of an autism spectrum disorder. Social communication and restricted repetitive behavior were rated within the Severe range.                 T              Awr            Cog           Com           Mot           RRB       Raw score        15  32               49             25               29       T-score             75              93               85             79               88  Awr = Social Awareness    Com = Social Engineer, structural = Social Cognition     Mot = Social Motivation   RRB = Restricted Interests and Repetitive Behavior   DSM-5 Compatible Subscales Raw score T-score  Social Communication and Interaction       [id number]   Restricted Interests and Repetitive Behavior         29    88    Ms. Nary's husband Lorilee Waldie also completed the SRS-2.  On this measure, Ms. Wegrzyn' T Score of 75 was in Moderate range. Scores in this range indicate deficiencies in reciprocal social behavior that are clinically significant and lead to substantial interference with everyday social interactions. Such scores are typical for individuals with autism spectrum disorders of moderate severity. Social communication was rated as moderately impaired while restricted repetitive behavior was rated as Severe.                 T              Awr            Cog           Com           Mot           RRB       Raw score        11              21                34             22               22       T-score             64              74               71             74               77  Awr = Social Awareness    Com = Social Engineer, structural = Social Cognition     Mot = Social Motivation   RRB = Restricted Interests and Repetitive Behavior   DSM-5 Compatible Subscales Raw score T-score  Social Communication and Interaction         88    73   Restricted Interests and Repetitive Behavior         22    77    Summary:   Ms. Dorff was evaluated during March 2024 related to attention deficits, anxiety, depressed  mood, and social interaction difficulty.  Ms. Reveal presents with a history of anxiety, depressed mood, and Obsessive-compulsive behavior.  She also reported having difficulty with distractibility, learning, understanding others, impulsivity, social interaction, overly intense interests, resistance to change, and sensory hypersensitivity.  Testing was recommended to evaluate for Autism spectrum disorder, ADHD, and other conditions that may be affecting attention, social interaction, and behavior.  Test results indicated low average overall intelligence (K-BIT 2), with better developed Nonverbal Reasoning than Verbal Comprehension.  Testing for neurocognitive processing indicated low overall functioning with poor attention, slow processing speed and responsiveness, and low memory, cognitive flexibility, and executive function on computerized measures.   Ratings for executive function corroborated these findings, indicating clinically significant impairment in all areas of behavior, emotion, and thought regulation measured.  Working memory was especially high.  Ratings behavior functioning suggested several Attention Deficit Hyperactivity Disorder (ADHD) related symptoms, while ratings for emotional functioning indicated clinically significant levels depression, general anxiety, stress, and obsessive-compulsive disorder (OCD) symptoms.  Observation of symptoms  related to Autism Spectrum Disorder (ASD) indicated significant difficulty with several aspects of social reciprocity and restricted repetitive behavior.  Ratings for this condition by Ms. Mcfayden indicated a severe level of impairment in these areas while ratings from her husband were moderately high but still clinically significant.  Ms. Vescovi appears to meet the criterion for ASD, ADHD, and OCD based on testing, ratings, and developmental history, with Ms. Wendler high levels of anxiety, and depressed mood likely because of these conditions.  Ms. Schoch has never received treatment for her attention/processing and social deficits.  While she can mask some of her difficulties and function well in a highly structured environment, she struggles greatly coping with naturally occurring unexpected life events and variability.  Recommendations include discussing results with Primary Care Physician or psychiatrist, continuing individual counseling, and accessing appropriate social supports.  See below for further recommendations.      Diagnostic Impression: DSM 5  Autism Spectrum Disorder - Level 1 - Needs Support  With Attention Deficit Hyperactivity Disorder - Combined Presentation Obsessive Compulsive Disorder  Recommendations: Recommendations are to discuss results with Primary Care Physician.  Continue daily medication for managing anxiety and depressed mood while considering low dose stimulant medication to address attention deficits.  Caution should be taken when prescribing stimulants as the increased arousal could trigger anxiety.    Individual counseling is recommended to continue to help Ms. Galasso with developing emotion regulation and coping skills.  Ms. Harberts would benefit from a structured approach that focuses on the teaching of emotional expression, self-advocacy, assertive communication, calming, social interaction skills, cognitive flexibility, and perspective taking.  Mental alertness/energy can also be  raised by increasing exercise, improving sleep, eating a healthy diet, and managing depression/stress.  Consult with a physician regarding any changes to physical regimen.   Ms. Wanninger does not appear to need workplace accommodations currently as her job is highly structured and predictable, although it would be very helpful for her to receive advanced notice of any changes to her work routine.   Consult the Social ThinkingT  by Leeroy Cha website for information and training in social skills.    Seek social support through an Autism spectrum support group well as accessing Nyra Jabs, Ph.D. regarding general information about ASD through either his website or You Tube.  Local support can be garnered through the [employer] in [place].  Support services and training may also be available through the [employer] of [place] and [facility].  Books/Websites ASD Adults Autism Spectrum Disorders: The Complete Guide to Understanding Autism, Asperger's Syndrome, Pervasive Developmental Disorder, and Other ASDs by Sammuel Hines The Hidden Curriculum: Practical Solutions for Understanding Unstated Rules in Social Situations by Gabriel Carina, [place] What Can I tell you About Asperger's Syndrome? A Guide for Friends and Family by [employer]  Neurodivergent Mind: Thriving in a World that [employer] by [employer] Unmasking Autism: Discovering the [employer] of Neurodiversity by Jonelle Sports, PhD        Revonda Standard. Holton Sidman, Ph.D. Licensed Psychologist - [facility] [place] Licensed psychologist [place] Executive dysfunction can significantly impact an individual's ability to function at home, at school, at work, or in the community. Several different approaches to executive function intervention have been developed by neuropsychologists, rehabilitation specialists, and others that are aimed at helping individuals  cope with executive dysfunction. One type of intervention involves the application of cognitive remediation techniques that typically emphasize repeated practice with tasks, such as memory and attention tasks, that are intended to improve the deficient skill. This form of intervention has demonstrated some success in treating people with executive dysfunction, such as individuals who have traumatic brain injury.   Another type of intervention involves teaching compensatory strategies. These strategies are designed to circumvent rather than directly improve deficits and also have demonstrated effectiveness in a number of patient populations. Still others emphasize the interaction of the individual within the environment and how antecedent environmental modifications or accommodations can facilitate executive functions. It should be noted that these approaches to dealing with executive dysfunction need not be mutually exclusive and many intervention programs are characterized by a hybrid approach.   Compensatory strategies themselves can take several forms including using external aides (e.g., use of a notebook), learning cognitive strategies (e.g., verbalization), and making environmental modifications (e.g., keeping workspace clutter-free). Research has demonstrated that both healthy adults as well as individuals who have executive deficits commonly rely on external aids for executive and other cognitive processes. The probability of success with compensatory strategies can be enhanced by building on an individual's existing strategies, systematically training the new strategies, and tailoring the compensatory strategies to the individual's unique needs and environmental contexts. More frequent use of aides or strategies and the use of a greater variety of aides is helpful when it comes to memory, and this also may hold true for executive dysfunction.   Adherence to routines and resistance to change may  reflect Ms. Kozel's need for predictability in her environment. An essential tenet of intervention is to facilitate feelings of security by maintaining a set of basic routines, then adjusting routines slightly in a stepwise fashion. Larger steps may provoke resistance and distress.  An individual who has difficulties shifting set can often adjust to changes in schedule or routine with the use of visual organizers such as pictures, schedules, planners, and calendar boards. This will let Ms. Rieves know the order of activities for the day and can alert her to variations in the usual sequence of events before they occur.  Displaying a daily schedule and reviewing it at the outset of the day can help an adult like Ms. Vary anticipate the sequence of events and can serve as a useful reminder of any changes in her daily routine. Any changes in scheduled activities, persons, or events can be placed on Ms. Pettinato schedule and called to her attention with as much advance notice as possible. This provides more time  for her to adjust to the change.    An individual who has difficulties shifting attention often needs to focus on only one task at a time. Presenting one task at a time and limiting choices to only one or two may be helpful.  Ms. Alvidrez might benefit from practicing her mental flexibility. Working with two or three familiar tasks and rotating them at regular intervals may help develop greater flexibility and help her become more accustomed to shifting.  Ms. Hilder might benefit from external prompting to shift attention, behavior, or cognitive set from one activity or focus to the next. This may include, for example, the use of a timer on a watch or other device.  Some individuals can benefit from set time limits for each task before a shift to the next task is required.  Ms. Nance might work on one activity or assignment for a set period then an alternative activity for the next period. Use of a timer can facilitate Ms. Bargiel  adjustment to change in activity.  Having regularly scheduled times during the day in which different tasks are carried out, separated by rest breaks or leisure activities, can facilitate shifting. For example, Ms. Villarosa might work on task A each morning and task B, each afternoon.  Sometimes working in small groups or being paired with another person can help individuals shift their focus or cognitive set. Others can model that it is time to change, cuing Ms. Daigneault by their behavior.  Individuals such as Ms. Pfahl have difficulty monitoring the impact of their behavior on others. Having a supportive partner/therapist/teacher provide her with subtle cues to help recognize the effects of her behavior may be helpful. Similarly, providing opportunities for self-monitoring in contexts where she can receive constructive feedback, such as group therapy or social skills training, may also prove beneficial.  Ms. Eid may not be able to consider the impact of her behavior in the immediate situation. It may be helpful or necessary to discuss behavior removed from the situation.  It may be helpful to videotape Ms. Ambroise interacting socially and then review it together. This allows her to see herself from another's perspective. Discussion of the videotape with someone, such as a counselor or therapist, will be important. This method should be considered carefully and approached collaboratively with her. Although videotaping can be a powerful tool, there also is potential for emotional consequences and negative effects on self-esteem. A social skills group may be a helpful venue to increase Ms. Palas awareness of the impact her behavior has on others. This can provide not only direct skill training but also an opportunity for helpful feedback from a counselor or peers in a safe setting. Some individuals like Ms. Karpowich may benefit from receiving reading material related to the nature and causes of their cognitive and behavioral  problems. The provision of articles or "fact sheets" can help improve awareness, increase responsiveness to more direct interventions, and facilitate understanding between individuals with cognitive deficits and their caregivers, family members, and supervisors.           [address] [place], [place]  [id number]  Phone: [phone]  Fax: [phone]   Positive Behavior Support for People with  Developmental Disorders  Make a Schedule - Example  Time/Order Activity Picture Comment Completed  7:00am Get Ready for School - Dress, Eat, Brush Teeth  Clothes Put Homework in Folder   8:00am Go to [employer] bus Raise your hand  before you speak   3:30pm Return from School and rest Bed or couch Quiet Activities only   4:00pm Start Homework Books Check spelling words   5:30pm Prepare for Dinner Table Set Table  Wash Hands   7:00pm Play/TV Time Toys TV Clean up toys when finished   8:00 Get Ready for Bed -  Pajamas, Brush Teeth, Story Bed In Bed by 8:30    Routines - A set of activities done the same way each time.  Examples Morning Routine -  Wake up  Go to bathroom  Get dressed  Eat breakfast  Brush teeth  Put on shoes.              Bedtime Routine - Get undressed Put on pajamas Brush teeth Relaxation activity (story or soft music) Get in bed  Cleaning Room Routine - Put toys in toy box Put books in bookshelf Put dirty clothes in hamper Put clean clothes in drawer Make Bed  Homework Routine -  Find quiet area with desk or table Get all needed books and papers Take out one assignment at a time Take a 5 minute break after each assignment Put completed assignments back in folder Put folder in backpack when all assignments completed  A time limit can be used for breaks instead of assignment completion.  A timer can be used. Place more enjoyable activities after the less preferred activities to reinforce  participation. Smaller routines can sometimes be combined to form larger routines.   Task Analysis - Breaking activities down into a series of small steps for the person to handle.  Cleaning Room Put toys in toy box Put dirty clothes in hamper Put books on bookshelf Make bed (Making bed can also be broken down into smaller steps)  Brushing Teeth Run water Place toothbrush under water Place toothbrush on sink Turn off water Remove toothpaste cap Squeeze toothpaste onto toothbrush Brush teeth Rinse toothbrush Fill cup with water Rinse mouth  Steps can be combined or added as needed depending upon functioning level.  Shaping - If a task or activity is too difficult and cannot be broken down any further, have the person do gradually closer approximations to the desired behavior until you get the response that you want. Example -  Sleeping independently Sleep with other person next to bed Sleep with other person in room by door Sleep with other person just outside of door Sleep with other person in next room Sleep without other person  Communicating desire for an object Person leads you to object Person points to object Person points to picture of object Person gives picture of object to you Person vocalizes (any kind of vocalization) while giving picture to you Person makes vocalization that sounds like the correct word Person says the correct word    Scaffolding Gradually expand the person's experiences.  For example, teach new behaviors (one at a time) within the context of a familiar location, routine, and person.  When the person has practiced and is comfortable exhibiting the new behavior in the familiar setting, then have the practice the behavior in a slightly new setting by changing either the location, routine, or person.  Later another aspect of the environment can be changed until the person is able to demonstrate the behavior comfortably in multiple settings,  with multiple people, and multiple circumstances.           Visual Prompts Picture Books - Place pictures of common objects, places, people, toys, activities, etc. in a  book.  The person can point to the pictures of what they want or they can take the pictures out of the book and hand them to you.  Consult with a Speech/Language pathologist regarding the most appropriate form of communication for that person.  Picture Schedules - Attach pictures to your schedules and routine lists to increase understanding.  Ideas for Creating Pictures:             Website: [employer].com             Software: Transport planner: Take pictures of common objects, places etc.   Sensory Regulation Avoid place of excess stimulation such as crowded department stores or restaurants.  Go to smaller places or during off peak hours.  If you have to go to a place that is highly stimulating, go for a short time or find a quiet place for the person to go for frequent breaks.  Ways to decrease excess stimulation: Quiet activities or soft music Firm touch such as deep massage or heavy blankets/vests Deep rhythmic breathing Separation from others Ways to increase stimulation - When a person is under stimulated you may notice more odd and repetitive behaviors.  Getting the person involved in a meaningful activity can reduce the occurrence.  Loud noise or music    Light touch Short rapid breaths Spicy foods Other activities such as exercise, art, scents, and swinging can be either stimulating or calming depending upon the person.  Check with an Occupational Therapist about specific sensory activities.   Intervention for when Person Loses Control Caregiver stays calm Person goes to quiet area or other people leave the area so it becomes quiet. Person is left alone to calm self with only monitoring from the caregiver. There should not be any intervention until the person is calm. Once the  person is calm, they can be redirected to another activity, given an alternative behavior perform instead of becoming upset, or have their options explained to them so they can make an appropriate choice. The person can be taught to take 10 deep breaths to assist in calming. The teaching should be done during times when the person is calm.  They can be reminded one time to use the breathing while upset. Physical restraint should only be used if the person is hurting himself or others. For prolonged behavioral outbursts, caregivers should switch monitoring the person every 15 minutes if possible so the care givers can remain calm themselves.  Transition - Steps to help with going from one activity to another: Set a specific time for when the activity will change and inform the person ahead of time.  Make sure they know what the new activity will be and detail any actions they need to do in between such as cleaning.  Use a timer or some other way of letting the person know when the current activity is finished. Give the person a brief warning about 2-3 minutes before the activity is complete so they can mentally prepare for the change. When going to a new place or activity, bringing a familiar object may help ease anxiety.    Reviewing a picture or other schedule with the person prior to the activities can help can give the person advanced warning of changes. Social Stories (brief stories about social situations) can be written with the person to help them understand the concept of changes and about going from  one activity to another.   Alternatives - Always give an alternative when the person is not able to get what they want. When a request is denied tell the person what they can have instead. When something is taken away, replace it with something else. If what the person wants is not available, let them know specifically when it will be available.  Use the schedule to show people when they can have  what they want.  Reinforcing Positive Behaviors - Let the person know when they have behaved appropriately. Be specific about what they had done and how it was helpful.  E.g. "when you shared your toy with your sister it made her happy." Be careful about using excessive excitement, praise, or touch (E.g. pats on the back).  Many people with Autism are sensitive to these and may view this as aversive. Stay calm and show positive emotion when giving feedback.  Correcting Inappropriate Behaviors - Let the person know when they have behaved inappropriately and show them a more appropriate behavior.     Wait until the person is calm before applying any correction. The new behavior should help the person achieve the same outcome as the inappropriate behavior, but in a different way. The new behavior should be something the person can do.   Break the action into small steps whenever teaching a new behavior. Help the person practice the new behavior so it can eventually replace the old behavior.      Providing Consequences Consequences for appropriate and inappropriate behavior can be given under the following circumstances: Make sure the person knows and understands the consequences ahead of time.  Use pictures to demonstrate the consequences if needed.   Have the consequences be consistent with the behavior being exhibited.  Example: person hits sibling. Right Way - person apologizes, uses words or gentle touch, and performs a positive activity for sibling. Wrong Way - person is sent to their room or has toys taken away.   Always follow through on the consequences once they are stated. Provide a balance of positive and negative consequences so they person maintains their self-esteem.  Look for partial elements of positive behaviors if needed.   Revonda Standard Marica Trentham, Ph.D. Licensed Clinical Psychologist - [facility] [place] [phone] Email: [email or web address]                 Rainey Pines, PhD

## 2022-08-29 NOTE — Progress Notes (Signed)
[place] Counselor/Therapist Progress Note  Patient ID: SHERINA MEASEL, MRN: [id number],    Date: 08/29/2022  Time Spent: 8:15 - 9:00 am   Treatment Type:  Testing - Feedback Session  Met with patint to review results of testing.  Patient was at home due to COVID-19 restrictions and session was conducted from therapist's office via video conferencing.  Patient verbally consented to telehealth.       Reported Symptoms: Patient presents with a history of anxiety, depressed mood, and Obsessive-compulsive behavior. Patient also reported having difficulty with distractibility, learning, understanding others, impulsivity, social interaction, overly intense interests, resistance to change, and sensory hypersensitivity. Testing recommended to evaluate for Autism spectrum disorder, ADHD, and other conditions that may be affecting attention, social interaction, and behavior.   Subjective: Interactive feedback was conducted (1 hr.).  It was discussed how patient met the criterion for Autism Spectrum Disorder and ADHD along with how these conditions affect her ability to complete tasks and relate to others.  Recommendations included discussing results with PCP, developing a visual organization system, continuing individual counseling, and seeking a social support group.  Patient expressed agreement with the results and recommendations.     Total Time of Testing: 7 hrs. Testing and Scoring: 4 hrs. Interactive Feedback:1 hr. Report Writing: 2 hrs.   Diagnosis: Autism spectrum disorder - Level 1 Attention deficit hyperactivity disorder (ADHD), predominantly inattentive type Obsessive Compulsive Disorder  Plan: Report to be sent to parent and referring provider.     Revonda Standard Jaidev Sanger, Ph.D.

## 2022-08-29 NOTE — Progress Notes (Signed)
                Korion Cuevas, PhD 

## 2022-09-13 ENCOUNTER — Ambulatory Visit (INDEPENDENT_AMBULATORY_CARE_PROVIDER_SITE_OTHER): Payer: BC Managed Care – PPO

## 2022-09-13 ENCOUNTER — Ambulatory Visit
Admission: EM | Admit: 2022-09-13 | Discharge: 2022-09-13 | Disposition: A | Discharge status: Home / Self Care | Payer: BC Managed Care – PPO

## 2022-09-13 DIAGNOSIS — M25551 Pain in right hip: Secondary | ICD-10-CM

## 2022-09-13 MED ORDER — CYCLOBENZAPRINE HCL 5 MG PO TABS: 5.0000 mg | ORAL_TABLET | Freq: Every day | ORAL | 0 refills | Status: AC

## 2022-09-13 MED ORDER — IBUPROFEN 800 MG PO TABS: 800.0000 mg | ORAL_TABLET | Freq: Three times a day (TID) | ORAL | 0 refills | Status: AC

## 2022-09-13 MED ORDER — KETOROLAC TROMETHAMINE 30 MG/ML IJ SOLN
60.0000 mg | Freq: Once | INTRAMUSCULAR | Status: AC
  Administered 2022-09-13: 60 mg via INTRAMUSCULAR

## 2022-09-13 NOTE — ED Provider Notes (Signed)
[facility]    CSN: [id number] Arrival date & time: 09/13/22  1023      History   Chief Complaint Chief Complaint  Patient presents with   right hip pain    HPI Melinda [name] is a 42 y.o. female.   HPI  She is in today with her mother for right-sided hip pain that started on Monday.  She denies any injury or previous problems with hip or low back pain.  She denies history of sciatica.  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  She is having numbness tingling going down the side of her right leg into the calf.  She is having weakness.  She came in today with a crutch.  She has tried ibuprofen 600s, IcyHot, Voltaren gel, heat without much relief.  She reports that the pain has progressed.  She was seen by primary care on Monday and was given a tetanus shot but did not mention her hip pain at that time. History reviewed. No pertinent past medical history.  There are no problems to display for this patient.   History reviewed. No pertinent surgical history.  OB History   No obstetric history on file.      Home Medications    Prior to Admission medications   Medication Sig Start Date End Date Taking? Authorizing Provider  albuterol (VENTOLIN HFA) 108 (90 Base) MCG/ACT inhaler 1 puff as needed Inhalation every 4 hrs for 7 days 03/14/21  Yes [provider]  citalopram (CELEXA) 40 MG tablet TAKE 1 & 1/2 TABLETS ORALLY ONCE DAILY FOR 60MG  TOTAL 03/26/22  Yes [provider]  lamoTRIgine (LAMICTAL) 25 MG tablet 1 tablet Oral once daily for 90 days 03/02/22  Yes [provider]  cyclobenzaprine (FLEXERIL) 5 MG tablet Take 1 tablet (5 mg total) by mouth at bedtime. 09/13/22  Yes Barbette Merino, NP  ibuprofen (ADVIL) 800 MG tablet Take 1 tablet (800 mg total) by mouth 3 (three) times daily. 09/13/22  Yes  Barbette Merino, NP  valACYclovir (VALTREX) 1000 MG tablet Take 1,000 mg by mouth daily. 04/10/19   [provider]    Family History Family History  Problem Relation Age of Onset   Breast cancer Maternal Grandmother     Social History Social History   Tobacco Use   Smoking status: Never   Smokeless tobacco: Never  Substance Use Topics   Alcohol use: Not Currently   Drug use: Not Currently     Allergies   Meloxicam and Prednisone   Review of Systems Review of Systems   Physical Exam Triage Vital Signs ED Triage Vitals  Enc Vitals Group     BP 09/13/22 1139 136/81     Pulse Rate 09/13/22 1139 90     Resp 09/13/22 1139 18     Temp 09/13/22 1139 98.3 F (36.8 C)     Temp Source 09/13/22 1139 Oral     SpO2 09/13/22 1139 97 %     Weight --      Height --      Head Circumference --      Peak Flow --      Pain Score 09/13/22 1140 10     Pain Loc --      Pain Edu? --      Excl. in GC? --  No data found.  Updated Vital Signs BP 136/81 (BP Location: Left Arm)   Pulse 90   Temp 98.3 F (36.8 C) (Oral)   Resp 18   SpO2 97%   Visual Acuity Right Eye Distance:   Left Eye Distance:   Bilateral Distance:    Right Eye Near:   Left Eye Near:    Bilateral Near:     Physical Exam Constitutional:      General: She is not in acute distress.    Appearance: She is obese.  HENT:     Head: Normocephalic and atraumatic.     Nose: Nose normal.     Mouth/Throat:     Mouth: Mucous membranes are moist.  Cardiovascular:     Rate and Rhythm: Normal rate.     Pulses: Normal pulses.  Pulmonary:     Effort: Pulmonary effort is normal.  Musculoskeletal:     Right hip: Tenderness present. Decreased range of motion. Decreased strength.     Left hip: Normal.     Right lower leg: No edema.     Left lower leg: No edema.  Skin:    Capillary Refill: Capillary refill takes less than 2 seconds.  Neurological:     General: No focal deficit present.     Mental  Status: She is alert and oriented to person, place, and time.  Psychiatric:        Mood and Affect: Mood normal.        Behavior: Behavior normal.      UC Treatments / Results  Labs (all labs ordered are listed, but only abnormal results are displayed) Labs Reviewed - No data to display  EKG   Radiology DG Hip Unilat With Pelvis 2-3 Views Right  Result Date: 09/13/2022 CLINICAL DATA:  Right hip pain EXAM: DG HIP (WITH OR WITHOUT PELVIS) 2-3V RIGHT COMPARISON:  None Available. FINDINGS: No fracture or dislocation is seen. There are no focal lytic lesions. Joint spaces in both hips appear symmetrical. No abnormal soft tissue calcifications are seen. IMPRESSION: No radiographic abnormalities are seen in right hip. Electronically Signed   By: Ernie AvenaPalani  Rathinasamy M.D.   On: 09/13/2022 12:42    Procedures Procedures (including critical care time)  Medications Ordered in UC Medications  ketorolac (TORADOL) 30 MG/ML injection 60 mg (60 mg Intramuscular Given 09/13/22 1217)    Initial Impression / Assessment and Plan / UC Course  I have reviewed the triage vital signs and the nursing notes.  Pertinent labs & imaging results that were available during my care of the patient were reviewed by me and considered in my medical decision making (see chart for details).     Hip pain Final Clinical Impressions(s) / UC Diagnoses   Final diagnoses:  Pain of right hip     Discharge Instructions      Overall your x-ray is negative for any acute abnormalities.  We do encourage you to follow up with your primary care provider if your symptoms persist for further evaluation with additional imaging. Due to your allergies of increased jitteriness with prednisone.  You have been prescribed ibuprofen 800 mg 3 times a day as needed for pain.  You have also been prescribed cyclobenzaprine 5 mg 1 tab qhs prn     ED Prescriptions     Medication Sig Dispense Auth. Provider   ibuprofen (ADVIL)  800 MG tablet Take 1 tablet (800 mg total) by mouth 3 (three) times daily. 21 tablet Barbette MerinoKing, Kinya Meine M, NP  cyclobenzaprine (FLEXERIL) 5 MG tablet Take 1 tablet (5 mg total) by mouth at bedtime. 15 tablet Barbette Merino, NP      PDMP not reviewed this encounter.   Thad Ranger Herriman, NP 09/13/22 1254

## 2022-09-13 NOTE — Discharge Instructions (Signed)
Overall your x-ray is negative for any acute abnormalities.  We do encourage you to follow up with your primary care provider if your symptoms persist for further evaluation with additional imaging. Due to your allergies of increased jitteriness with prednisone.  You have been prescribed ibuprofen 800 mg 3 times a day as needed for pain.  You have also been prescribed cyclobenzaprine 5 mg 1 tab qhs prn

## 2022-09-13 NOTE — ED Triage Notes (Signed)
Pt c/o right hip pain shooting down right let onset ~ Monday

## 2022-10-16 ENCOUNTER — Other Ambulatory Visit: Payer: Self-pay | Admitting: Family Medicine

## 2022-10-16 DIAGNOSIS — Z1231 Encounter for screening mammogram for malignant neoplasm of breast: Secondary | ICD-10-CM

## 2022-10-17 DIAGNOSIS — Z1231 Encounter for screening mammogram for malignant neoplasm of breast: Secondary | ICD-10-CM

## 2022-11-07 ENCOUNTER — Ambulatory Visit
Admission: RE | Admit: 2022-11-07 | Discharge: 2022-11-07 | Disposition: A | Discharge status: Home / Self Care | Payer: BC Managed Care – PPO | Source: Ambulatory Visit | Attending: Family Medicine | Admitting: Family Medicine

## 2022-11-07 DIAGNOSIS — Z1231 Encounter for screening mammogram for malignant neoplasm of breast: Secondary | ICD-10-CM

## 2023-07-10 ENCOUNTER — Ambulatory Visit: Payer: Self-pay

## 2023-10-30 ENCOUNTER — Other Ambulatory Visit: Payer: Self-pay | Admitting: Family Medicine

## 2023-10-30 DIAGNOSIS — Z1231 Encounter for screening mammogram for malignant neoplasm of breast: Secondary | ICD-10-CM

## 2023-11-21 ENCOUNTER — Ambulatory Visit
Admission: RE | Admit: 2023-11-21 | Discharge: 2023-11-21 | Disposition: A | Discharge status: Home / Self Care | Source: Ambulatory Visit

## 2023-11-21 DIAGNOSIS — Z1231 Encounter for screening mammogram for malignant neoplasm of breast: Secondary | ICD-10-CM

## 2023-12-21 ENCOUNTER — Encounter: Payer: Self-pay | Admitting: Advanced Practice Midwife

## 2023-12-30 IMAGING — MG MM DIGITAL SCREENING BILAT W/ TOMO AND CAD
8 series · 8 of 24 positions shown · non-contrast
Comparison: Previous exam(s).

CLINICAL DATA: Screening.

EXAM:
DIGITAL SCREENING BILATERAL MAMMOGRAM WITH TOMOSYNTHESIS AND CAD
TECHNIQUE: Bilateral screening digital craniocaudal and mediolateral oblique
mammograms were obtained. Bilateral screening digital breast
tomosynthesis was performed. The images were evaluated with
computer-aided detection.

[R CC synth-2D]
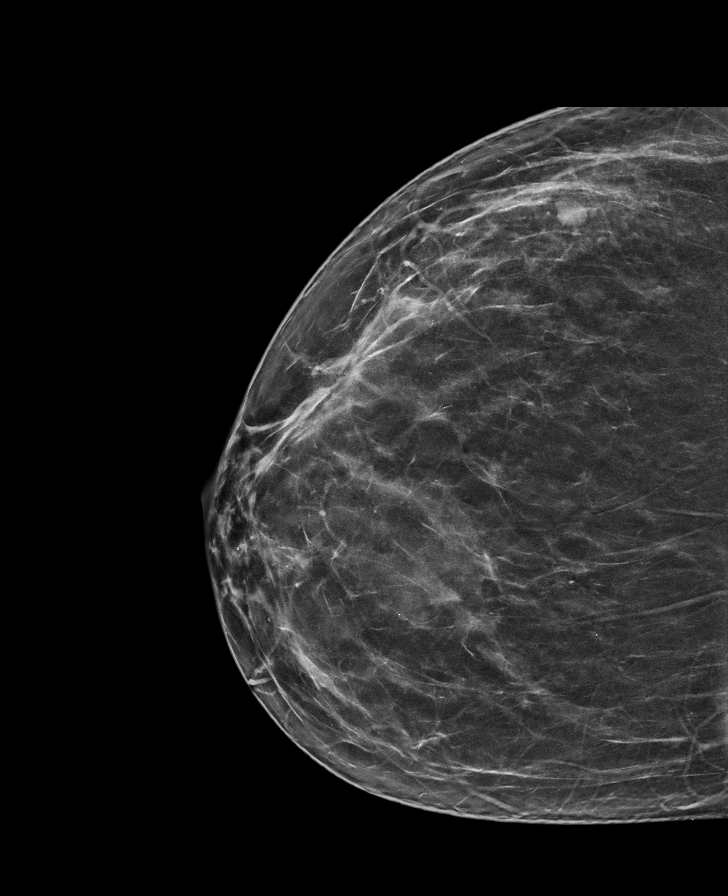

[R MLO synth-2D]
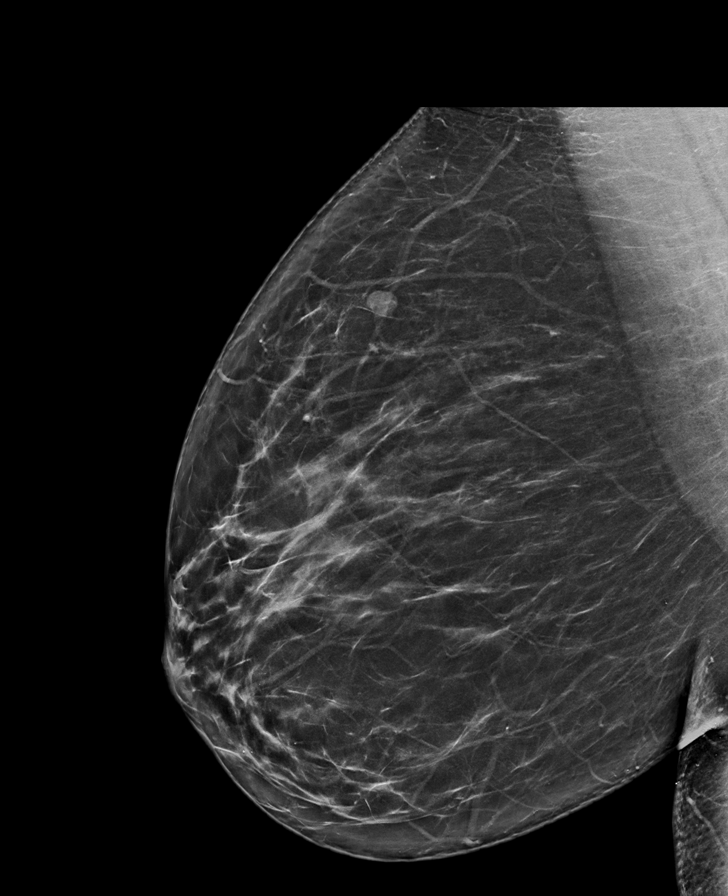

[L MLO synth-2D]
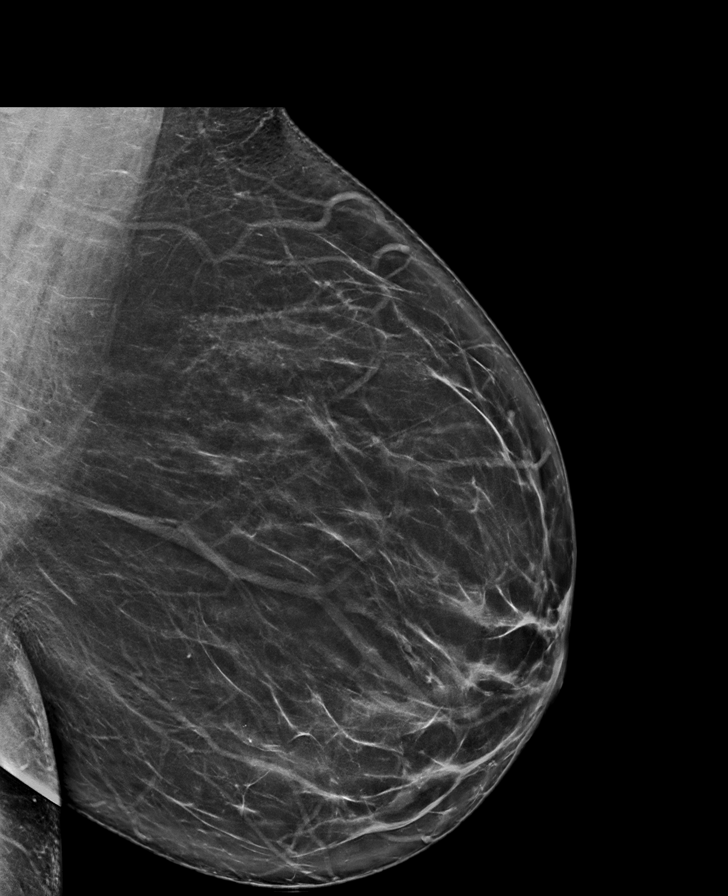

[L CC synth-2D]
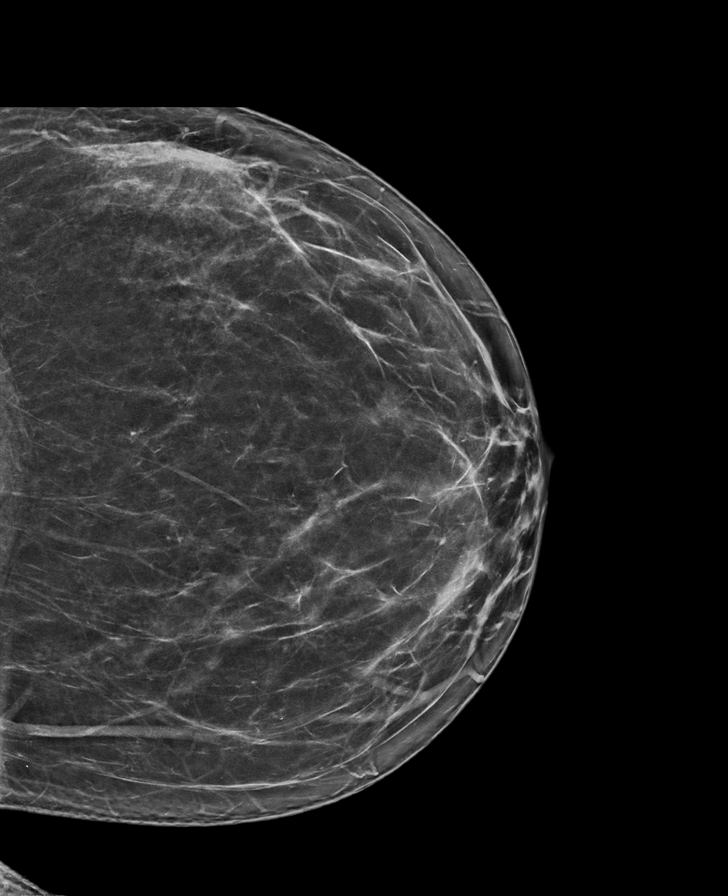

[L CC tomo · tomo slice 40/79.0]
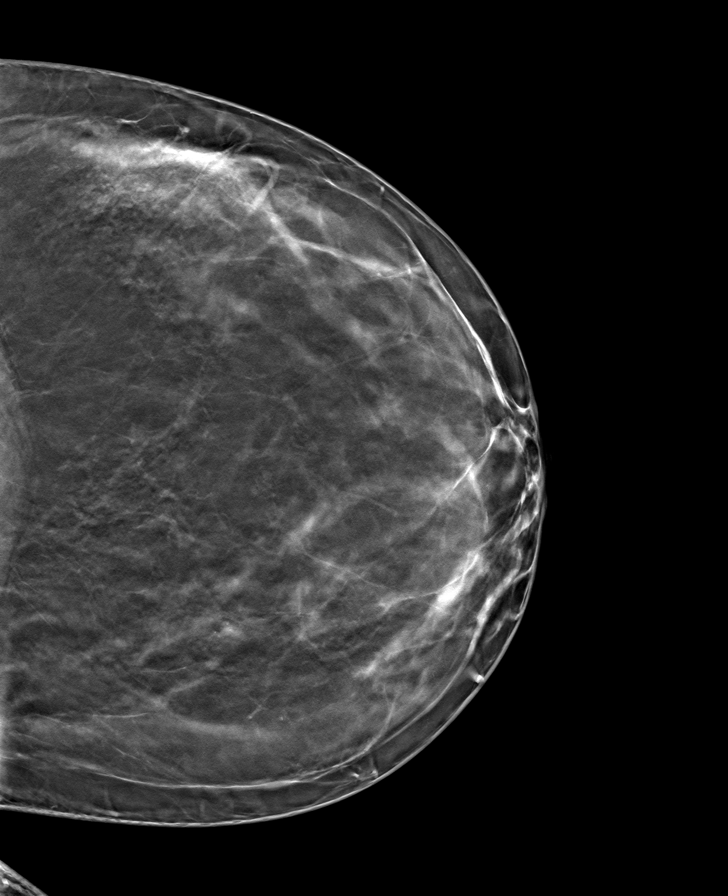

[R MLO tomo · tomo slice 45/89.0]
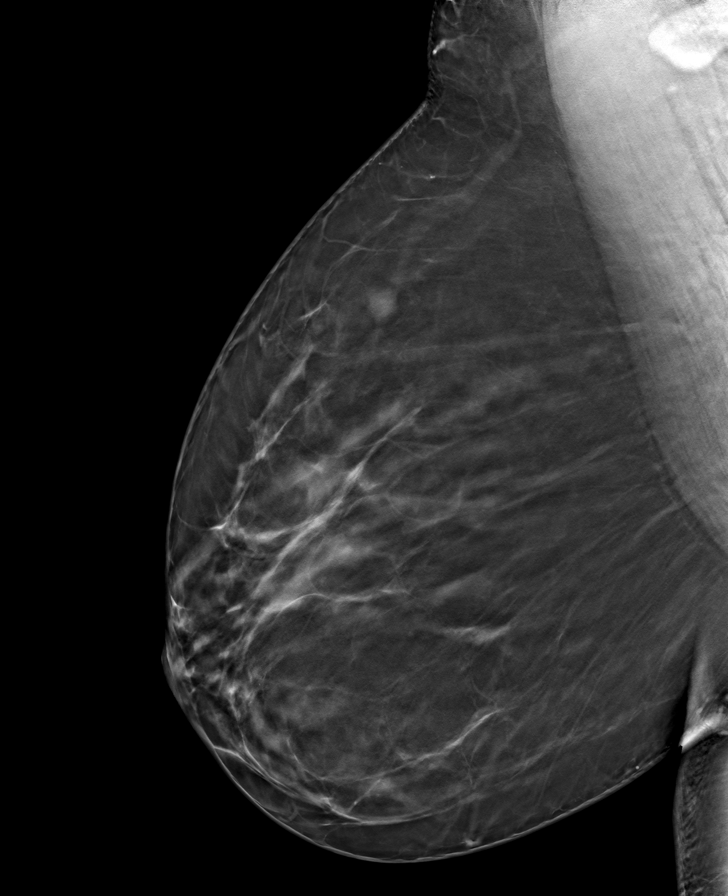

[L MLO tomo · tomo slice 45/90.0]
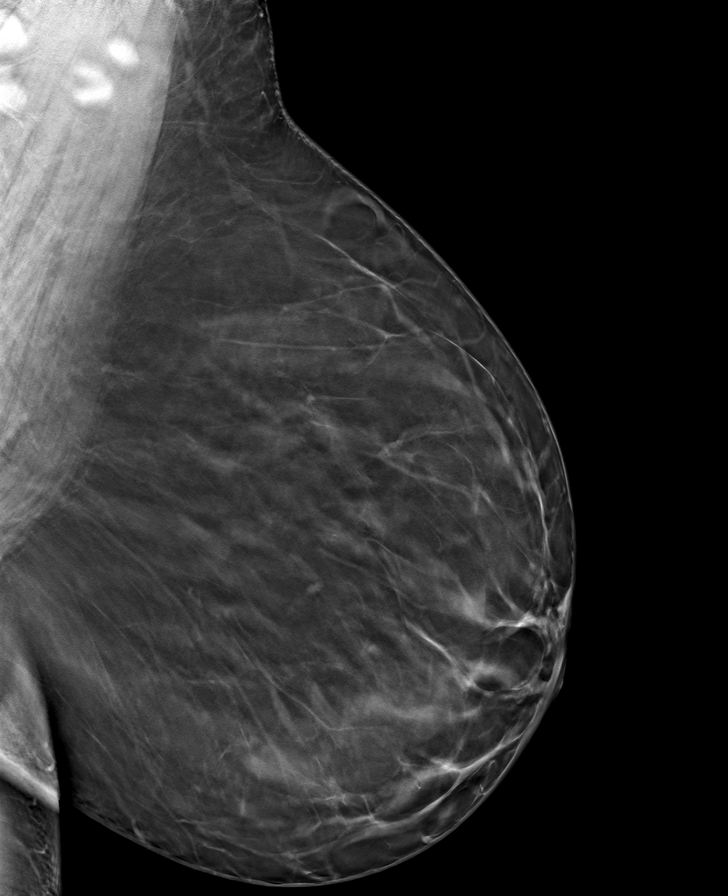

[R CC tomo · tomo slice 39/76.0]
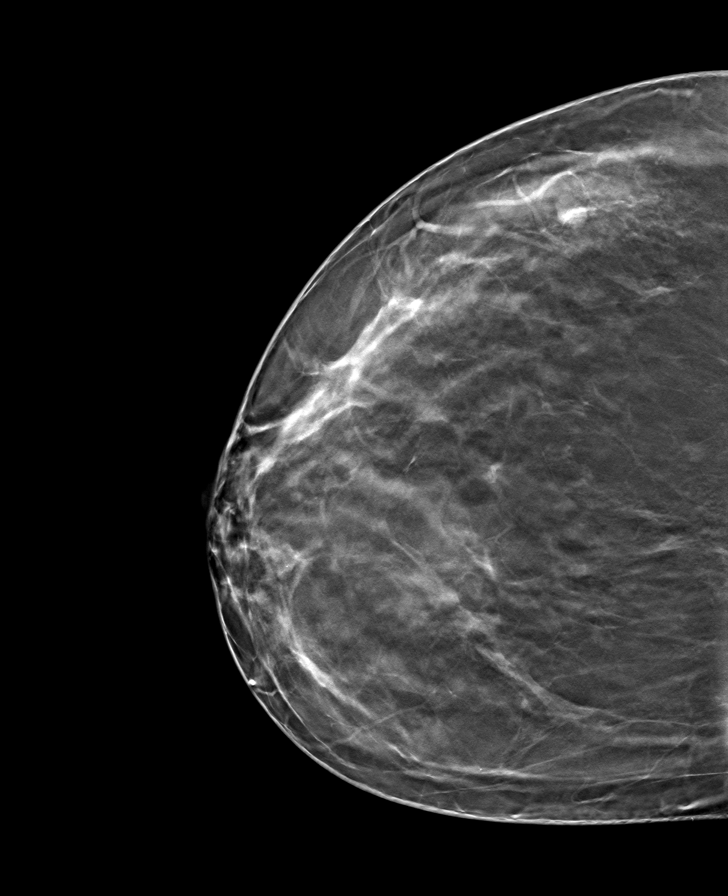

[8 of 24 positions shown; findings below may reference images not displayed]

ACR Breast Density Category b: There are scattered areas of
fibroglandular density.
FINDINGS: There are no findings suspicious for malignancy.
IMPRESSION: No mammographic evidence of malignancy. A result letter of this
screening mammogram will be mailed directly to the patient.

RECOMMENDATION:
Screening mammogram in one year. (Code:51-O-LD2)

BI-RADS CATEGORY  1: Negative.

## 2024-04-24 ENCOUNTER — Emergency Department (HOSPITAL_COMMUNITY)

## 2024-04-24 ENCOUNTER — Emergency Department (HOSPITAL_COMMUNITY)
Admission: EM | Admit: 2024-04-24 | Discharge: 2024-04-25 | Disposition: A | Discharge status: Home / Self Care | Attending: Emergency Medicine | Admitting: Emergency Medicine

## 2024-04-24 DIAGNOSIS — E876 Hypokalemia: Secondary | ICD-10-CM

## 2024-04-24 DIAGNOSIS — R410 Disorientation, unspecified: Secondary | ICD-10-CM | POA: Insufficient documentation

## 2024-04-24 DIAGNOSIS — R55 Syncope and collapse: Secondary | ICD-10-CM | POA: Insufficient documentation

## 2024-04-24 DIAGNOSIS — R519 Headache, unspecified: Secondary | ICD-10-CM | POA: Insufficient documentation

## 2024-04-24 DIAGNOSIS — D649 Anemia, unspecified: Secondary | ICD-10-CM

## 2024-04-24 DIAGNOSIS — R42 Dizziness and giddiness: Secondary | ICD-10-CM | POA: Diagnosis present

## 2024-04-24 LAB — CBC WITH DIFFERENTIAL/PLATELET
Abs Immature Granulocytes: 0.02 K/uL (ref 0.00–0.07)
Basophils Absolute: 0 K/uL (ref 0.0–0.1)
Basophils Relative: 0 %
Eosinophils Absolute: 0.2 K/uL (ref 0.0–0.5)
Eosinophils Relative: 2 %
HCT: 33.8 % — ABNORMAL LOW (ref 36.0–46.0)
Hemoglobin: 11.2 g/dL — ABNORMAL LOW (ref 12.0–15.0)
Immature Granulocytes: 0 %
Lymphocytes Relative: 35 %
Lymphs Abs: 3.1 K/uL (ref 0.7–4.0)
MCH: 30.1 pg (ref 26.0–34.0)
MCHC: 33.1 g/dL (ref 30.0–36.0)
MCV: 90.9 fL (ref 80.0–100.0)
Monocytes Absolute: 0.6 K/uL (ref 0.1–1.0)
Monocytes Relative: 7 %
Neutro Abs: 5.1 K/uL (ref 1.7–7.7)
Neutrophils Relative %: 56 %
Platelets: 361 K/uL (ref 150–400)
RBC: 3.72 MIL/uL — ABNORMAL LOW (ref 3.87–5.11)
RDW: 13.9 % (ref 11.5–15.5)
WBC: 9 K/uL (ref 4.0–10.5)
nRBC: 0 % (ref 0.0–0.2)

## 2024-04-24 LAB — I-STAT CHEM 8, ED
BUN: 6 mg/dL (ref 6–20)
Calcium, Ion: 1.08 mmol/L — ABNORMAL LOW (ref 1.15–1.40)
Chloride: 105 mmol/L (ref 98–111)
Creatinine, Ser: 0.9 mg/dL (ref 0.44–1.00)
Glucose, Bld: 90 mg/dL (ref 70–99)
HCT: 35 % — ABNORMAL LOW (ref 36.0–46.0)
Hemoglobin: 11.9 g/dL — ABNORMAL LOW (ref 12.0–15.0)
Potassium: 3.4 mmol/L — ABNORMAL LOW (ref 3.5–5.1)
Sodium: 137 mmol/L (ref 135–145)
TCO2: 22 mmol/L (ref 22–32)

## 2024-04-24 LAB — TSH: TSH: 2.639 u[IU]/mL (ref 0.350–4.500)

## 2024-04-24 MED ORDER — LACTATED RINGERS IV SOLN: INTRAVENOUS | Status: DC

## 2024-04-24 MED ORDER — GADOBUTROL 1 MMOL/ML IV SOLN
10.0000 mL | Freq: Once | INTRAVENOUS | Status: AC | PRN
  Administered 2024-04-24: 10 mL via INTRAVENOUS

## 2024-04-24 MED ORDER — IOHEXOL 350 MG/ML SOLN
75.0000 mL | Freq: Once | INTRAVENOUS | Status: AC | PRN
  Administered 2024-04-24: 75 mL via INTRAVENOUS

## 2024-04-24 MED ORDER — METOCLOPRAMIDE HCL 5 MG/ML IJ SOLN
10.0000 mg | Freq: Once | INTRAMUSCULAR | Status: AC
  Administered 2024-04-24: 10 mg via INTRAVENOUS
  Filled 2024-04-24: qty 2

## 2024-04-24 NOTE — ED Notes (Signed)
 Pt is confused and an unreliable narrator at this time struggling with questions. Triage questions will be held at this time.

## 2024-04-24 NOTE — ED Provider Notes (Signed)
 Care assumed from Dr. Doretha, patient with headache and dizziness. She also had a syncopal episode, and has been confused. MRI is pending. Plan is for discharge if no acute process.  MRI shows no acute intracranial abnormality.  I have independently reviewed the images, and agree with the radiologist's interpretation.  I am discharging her with neurology referral.  Results for orders placed or performed during the hospital encounter of 04/24/24  I-stat chem 8, ED (not at [facility], [facility] or [facility])   Collection Time: 04/24/24  7:25 PM  Result Value Ref Range   Sodium 137 135 - 145 mmol/L   Potassium 3.4 (L) 3.5 - 5.1 mmol/L   Chloride 105 98 - 111 mmol/L   BUN 6 6 - 20 mg/dL   Creatinine, Ser 9.09 0.44 - 1.00 mg/dL   Glucose, Bld 90 70 - 99 mg/dL   Calcium, Ion 8.91 (L) 1.15 - 1.40 mmol/L   TCO2 22 22 - 32 mmol/L   Hemoglobin 11.9 (L) 12.0 - 15.0 g/dL   HCT 64.9 (L) 63.9 - 53.9 %  CBC with Differential/Platelet   Collection Time: 04/24/24  8:05 PM  Result Value Ref Range   WBC 9.0 4.0 - 10.5 K/uL   RBC 3.72 (L) 3.87 - 5.11 MIL/uL   Hemoglobin 11.2 (L) 12.0 - 15.0 g/dL   HCT 66.1 (L) 63.9 - 53.9 %   MCV 90.9 80.0 - 100.0 fL   MCH 30.1 26.0 - 34.0 pg   MCHC 33.1 30.0 - 36.0 g/dL   RDW 86.0 88.4 - 84.4 %   Platelets 361 150 - 400 K/uL   nRBC 0.0 0.0 - 0.2 %   Neutrophils Relative % 56 %   Neutro Abs 5.1 1.7 - 7.7 K/uL   Lymphocytes Relative 35 %   Lymphs Abs 3.1 0.7 - 4.0 K/uL   Monocytes Relative 7 %   Monocytes Absolute 0.6 0.1 - 1.0 K/uL   Eosinophils Relative 2 %   Eosinophils Absolute 0.2 0.0 - 0.5 K/uL   Basophils Relative 0 %   Basophils Absolute 0.0 0.0 - 0.1 K/uL   Immature Granulocytes 0 %   Abs Immature Granulocytes 0.02 0.00 - 0.07 K/uL  Vitamin B12   Collection Time: 04/24/24 10:54 PM  Result Value Ref Range   Vitamin B-12 250 180 - 914 pg/mL  Folate, serum, performed at [facility]   Collection Time: 04/24/24 10:54 PM  Result Value Ref Range   Folate 11.1 >5.9  ng/mL  TSH   Collection Time: 04/24/24 10:54 PM  Result Value Ref Range   TSH 2.639 0.350 - 4.500 uIU/mL   MR Brain W and Wo Contrast Result Date: 04/24/2024 EXAM: MRI BRAIN WITH AND WITHOUT CONTRAST 04/24/2024 11:43:16 PM TECHNIQUE: Multiplanar multisequence MRI of the head/brain was performed with and without the administration of intravenous contrast. COMPARISON: Same day CTA head and neck CLINICAL HISTORY: Headache, neuro deficit FINDINGS: BRAIN AND VENTRICLES: No acute infarct. No acute intracranial hemorrhage. No mass effect or midline shift. No hydrocephalus. Normal flow voids. No mass or abnormal enhancement. Partially empty sella. ORBITS: No acute abnormality. SINUSES: No acute abnormality. BONES AND SOFT TISSUES: Normal bone marrow signal and enhancement. No acute soft tissue abnormality. IMPRESSION: 1. No acute intracranial abnormality. 2. No mass or abnormal enhancement. Electronically signed by: Gilmore Molt MD 04/24/2024 11:51 PM EST RP Workstation: [id number]   CT ANGIO HEAD NECK W WO CM Result Date: 04/24/2024 EXAM: CT HEAD WITHOUT CTA HEAD AND NECK WITH AND WITHOUT 04/24/2024  08:25:57 PM TECHNIQUE: CTA of the head and neck was performed with and without the administration of intravenous contrast. Noncontrast CT of the head with reconstructed 2-D images are also provided for review. Multiplanar 2D and/or 3D reformatted images are provided for review. Automated exposure control, iterative reconstruction, and/or weight based adjustment of the mA/kV was utilized to reduce the radiation dose to as low as reasonably achievable. COMPARISON: None available CLINICAL HISTORY: Neuro deficit, acute, stroke suspected FINDINGS: BRAIN AND VENTRICLES: No acute intracranial hemorrhage. No mass effect. No midline shift. No extra-axial fluid collection. No evidence of acute infarct. No hydrocephalus. Partially empty sella. ORBITS: No acute abnormality. SINUSES AND MASTOIDS: Opacified right frontal  sinus. AORTIC ARCH AND ARCH VESSELS: No dissection or arterial injury. No significant stenosis of the brachiocephalic or subclavian arteries. CERVICAL CAROTID ARTERIES: No dissection, arterial injury, or hemodynamically significant stenosis by NASCET criteria. CERVICAL VERTEBRAL ARTERIES: No dissection, arterial injury, or significant stenosis. LUNGS AND MEDIASTINUM: Unremarkable. SOFT TISSUES: No acute abnormality. BONES: No acute abnormality. CTA HEAD: ANTERIOR CIRCULATION: No significant stenosis of the internal carotid arteries. No significant stenosis of the anterior cerebral arteries. Hypoplastic left A1 ACA. No significant stenosis of the middle cerebral arteries. No aneurysm. POSTERIOR CIRCULATION: No significant stenosis of the posterior cerebral arteries. No significant stenosis of the basilar artery. No significant stenosis of the vertebral arteries. No aneurysm. OTHER: No dural venous sinus thrombosis on this non-dedicated study. IMPRESSION: 1. No large vessel occlusion in the head or neck. Electronically signed by: Gilmore Molt MD 04/24/2024 09:15 PM EST RP Workstation: [id number]      Raford Lenis, MD 04/25/24 [phone]

## 2024-04-24 NOTE — ED Provider Notes (Signed)
 [place] EMERGENCY DEPARTMENT AT [facility] Provider Note   CSN: [id number] Arrival date & time: 04/24/24  1753     Patient presents with: No chief complaint on file.   Melinda [name] is a 43 y.o. female.   Patient is a 43 year old female with a history of anxiety and depression who is presenting today from urgent care with headache, confusion and trouble with her memory.  Patient reports she really started feeling not herself last night after church.  She reports that she felt off which she describes as having a mild headache, just not feeling good in general.  She reports that she went to work today and while she was at work she started feeling dizzy every time she would get up and try to walk over to the printer she would feel lightheaded like she might pass out.  She also started having worsening headache which she reports is mostly on the right side and noticed a little bit of tingling in her face.  She went to the drugstore and had her blood pressure checked and they wrote it down and told her it was elevated.  She denies a history of hypertension in the past.  She reports at this time things seem to go in and out and she cannot really remember things well and she has been confused which she describes is not being able to remember things that she normally can remember.  She does not think she had any issues walking or moving her arms or legs.  She denies any numbness or tingling now.  She continues to have a headache but denies any visual changes.  No recent medication changes.  She did go to urgent care prior to this and they reported that she had a syncopal event while being examined and could not recall things like the time or her birthdate but she was able to recognize her sister.  She reports she has had a lot of headaches recently and has just been treating with over-the-counter medications.  No infectious symptoms.  The history is provided by the patient and medical  records.       Prior to Admission medications   Medication Sig Start Date End Date Taking? Authorizing Provider  albuterol (VENTOLIN HFA) 108 (90 Base) MCG/ACT inhaler 1 puff as needed Inhalation every 4 hrs for 7 days 03/14/21   [provider]  citalopram (CELEXA) 40 MG tablet TAKE 1 & 1/2 TABLETS ORALLY ONCE DAILY FOR 60MG  TOTAL 03/26/22   [provider]  cyclobenzaprine  (FLEXERIL ) 5 MG tablet Take 1 tablet (5 mg total) by mouth at bedtime. 09/13/22   Myrna Camelia HERO, NP  ibuprofen  (ADVIL ) 800 MG tablet Take 1 tablet (800 mg total) by mouth 3 (three) times daily. 09/13/22   Myrna Camelia HERO, NP  lamoTRIgine (LAMICTAL) 25 MG tablet 1 tablet Oral once daily for 90 days 03/02/22   [provider]  valACYclovir (VALTREX) 1000 MG tablet Take 1,000 mg by mouth daily. 04/10/19   [provider]    Allergies: Meloxicam and Prednisone     Review of Systems  Updated Vital Signs BP (!) 143/85   Pulse 79   Temp 98.2 F (36.8 C)   Resp 17   SpO2 100%   Physical Exam Vitals and nursing note reviewed.  Constitutional:      General: She is not in acute distress.    Appearance: She is well-developed.  HENT:     Head: Normocephalic and atraumatic.  Eyes:     General: No visual field deficit.    Conjunctiva/sclera: Conjunctivae normal.     Pupils: Pupils are equal, round, and reactive to light.  Cardiovascular:     Rate and Rhythm: Normal rate and regular rhythm.     Heart sounds: No murmur heard. Pulmonary:     Effort: Pulmonary effort is normal. No respiratory distress.     Breath sounds: Normal breath sounds. No wheezing or rales.  Abdominal:     General: There is no distension.     Palpations: Abdomen is soft.     Tenderness: There is no abdominal tenderness. There is no guarding or rebound.  Musculoskeletal:        General: No tenderness. Normal range of motion.     Cervical back: Normal range of motion and neck supple.  Skin:    General: Skin  is warm and dry.     Findings: No erythema or rash.  Neurological:     Mental Status: She is alert and oriented to person, place, and time.     Cranial Nerves: No dysarthria or facial asymmetry.     Sensory: Sensation is intact.     Motor: Motor function is intact. No weakness or pronator drift.     Coordination: Coordination is intact. Finger-Nose-Finger Test and Heel to Riner Test normal.     Comments: Still having trouble remembering medication she normally takes but can recall her birthday and date and location.  Psychiatric:        Mood and Affect: Mood normal.        Behavior: Behavior normal.     (all labs ordered are listed, but only abnormal results are displayed) Labs Reviewed  CBC WITH DIFFERENTIAL/PLATELET - Abnormal; Notable for the following components:      Result Value   RBC 3.72 (*)    Hemoglobin 11.2 (*)    HCT 33.8 (*)    All other components within normal limits  I-STAT CHEM 8, ED - Abnormal; Notable for the following components:   Potassium 3.4 (*)    Calcium, Ion 1.08 (*)    Hemoglobin 11.9 (*)    HCT 35.0 (*)    All other components within normal limits  VITAMIN B12  FOLATE  TSH    EKG: None  Radiology: CT ANGIO HEAD NECK W WO CM Result Date: 04/24/2024 EXAM: CT HEAD WITHOUT CTA HEAD AND NECK WITH AND WITHOUT 04/24/2024 08:25:57 PM TECHNIQUE: CTA of the head and neck was performed with and without the administration of intravenous contrast. Noncontrast CT of the head with reconstructed 2-D images are also provided for review. Multiplanar 2D and/or 3D reformatted images are provided for review. Automated exposure control, iterative reconstruction, and/or weight based adjustment of the mA/kV was utilized to reduce the radiation dose to as low as reasonably achievable. COMPARISON: None available CLINICAL HISTORY: Neuro deficit, acute, stroke suspected FINDINGS: BRAIN AND VENTRICLES: No acute intracranial hemorrhage. No mass effect. No midline shift. No  extra-axial fluid collection. No evidence of acute infarct. No hydrocephalus. Partially empty sella. ORBITS: No acute abnormality. SINUSES AND MASTOIDS: Opacified right frontal sinus. AORTIC ARCH AND ARCH VESSELS: No dissection or arterial injury. No significant stenosis of the brachiocephalic or subclavian arteries. CERVICAL CAROTID ARTERIES: No dissection, arterial injury, or hemodynamically significant stenosis by NASCET criteria. CERVICAL VERTEBRAL ARTERIES: No dissection, arterial injury, or significant stenosis. LUNGS AND MEDIASTINUM: Unremarkable. SOFT TISSUES: No acute abnormality. BONES: No acute abnormality. CTA HEAD: ANTERIOR CIRCULATION: No significant stenosis of  the internal carotid arteries. No significant stenosis of the anterior cerebral arteries. Hypoplastic left A1 ACA. No significant stenosis of the middle cerebral arteries. No aneurysm. POSTERIOR CIRCULATION: No significant stenosis of the posterior cerebral arteries. No significant stenosis of the basilar artery. No significant stenosis of the vertebral arteries. No aneurysm. OTHER: No dural venous sinus thrombosis on this non-dedicated study. IMPRESSION: 1. No large vessel occlusion in the head or neck. Electronically signed by: Gilmore Molt MD 04/24/2024 09:15 PM EST RP Workstation: [id number]     Procedures   Medications Ordered in the ED  lactated ringers infusion ( Intravenous New Bag/Given 04/24/24 1958)  metoCLOPramide (REGLAN) injection 10 mg (10 mg Intravenous Given 04/24/24 1959)  iohexol (OMNIPAQUE) 350 MG/ML injection 75 mL (75 mLs Intravenous Contrast Given 04/24/24 2027)                                    Medical Decision Making Amount and/or Complexity of Data Reviewed External Data Reviewed: notes. Labs: ordered. Decision-making details documented in ED Course. Radiology: ordered and independent interpretation performed. Decision-making details documented in ED Course.  Risk Prescription drug  management.   Pt  presenting today with a complaint that caries a high risk for morbidity and mortality. Here today with the above complaints.  Concern for possible stroke, subarachnoid hemorrhage, hypertensive urgency, migraine headache with complex neurosymptoms, transient global amnesia.  At this time patient is not a code stroke as her symptoms have been going on since at least noon today or may be prior to that she is outside of the window and does not have LVO symptoms.  Also she does not describe the dizziness as a vertiginous type feeling.  Normal cerebellar findings here but did not walk the patient yet. 11:23 PM I independently interpreted patient's labs which showed normal CBC and Chem-8 without acute findings.  I have independently visualized and interpreted pt's images today.  CTA of the head and neck without evidence of bleed and radiology reports no large vessel occlusion in the head or neck.  On repeat evaluation patient reports her headache is getting better but she still having some difficulty remembering things.  Discussed this with neurology who reports patient should have an MRI and B12, folate and TSH levels.  If those things are normal patient can be discharged home with an outpatient workup.  Repeat blood pressure is 143/85.  She does not use drugs or alcohol.  Overall her symptoms are improved just not completely back to baseline.      Final diagnoses:  None    ED Discharge Orders     None          Doretha Folks, MD 04/24/24 2325

## 2024-04-24 NOTE — ED Provider Notes (Incomplete)
 Care assumed from Dr. Doretha, patient with headache and dizziness. She also had a syncopal episode, and has been confused. MRI is pending. Plan is for discharge if no acute process.

## 2024-04-24 NOTE — ED Triage Notes (Addendum)
 According to [facility]: Pt went to urgent care today complaining of headache when she arrived she showed signs of confusion which are still present and had a syncopal episode while being examined. Pt does not recall leaving work or driving to urgent care. Pt is struggling with time and her birthday. Pt is still able to recognize sister. Sister reports she was fine yesterday. Pt has no signs or that would indicating stroke according to ems, however when she looks to the left her eyes closes when following ems's finger she begins to sleep and has problems tracking. Pt has had repetitive questioning.   Vitals CBG 97 180/84 Hr 84 normal sinus rythm RR 16 Spo2 99% on room air.

## 2024-04-25 LAB — FOLATE: Folate: 11.1 ng/mL (ref >5.9)

## 2024-04-25 LAB — VITAMIN B12: Vitamin B-12: 250 pg/mL (ref 180–914)

## 2024-04-25 MED ORDER — POTASSIUM CHLORIDE CRYS ER 20 MEQ PO TBCR
40.0000 meq | EXTENDED_RELEASE_TABLET | Freq: Once | ORAL | Status: AC
  Administered 2024-04-25: 40 meq via ORAL
  Filled 2024-04-25: qty 2

## 2024-04-25 NOTE — Discharge Instructions (Addendum)
 Your evaluation in the emergency department did not show any serious problems.  Please follow-up with a neurologist for further outpatient evaluation.  Return to the emergency department if you have any new or concerning symptoms.

## 2024-08-27 ENCOUNTER — Ambulatory Visit: Admitting: Neurology
